# Patient Record
Sex: Female | Born: 1975 | Race: White | Hispanic: No | Marital: Single | Smoking: Current every day smoker
Health system: Southern US, Community
[De-identification: ages and names within clinical notes are randomized; demographics above are authoritative.]

## PROBLEM LIST (undated history)

## (undated) DIAGNOSIS — Z789 Other specified health status: Secondary | ICD-10-CM

## (undated) HISTORY — PX: CHOLECYSTECTOMY: SHX55

---

## 1999-04-14 ENCOUNTER — Encounter: Payer: Self-pay | Admitting: Internal Medicine

## 1999-04-14 ENCOUNTER — Ambulatory Visit (HOSPITAL_COMMUNITY): Admission: RE | Admit: 1999-04-14 | Discharge: 1999-04-14 | Payer: Self-pay | Admitting: Internal Medicine

## 1999-05-01 ENCOUNTER — Encounter: Payer: Self-pay | Admitting: General Surgery

## 1999-05-02 ENCOUNTER — Encounter: Payer: Self-pay | Admitting: General Surgery

## 1999-05-02 ENCOUNTER — Ambulatory Visit (HOSPITAL_COMMUNITY): Admission: RE | Admit: 1999-05-02 | Discharge: 1999-05-03 | Payer: Self-pay | Admitting: General Surgery

## 2003-02-05 ENCOUNTER — Other Ambulatory Visit: Admission: RE | Admit: 2003-02-05 | Discharge: 2003-02-05 | Payer: Self-pay | Admitting: Obstetrics & Gynecology

## 2004-02-03 ENCOUNTER — Emergency Department (HOSPITAL_COMMUNITY): Admission: EM | Admit: 2004-02-03 | Discharge: 2004-02-03 | Payer: Self-pay | Admitting: Emergency Medicine

## 2004-02-15 ENCOUNTER — Other Ambulatory Visit: Admission: RE | Admit: 2004-02-15 | Discharge: 2004-02-15 | Payer: Self-pay | Admitting: Obstetrics and Gynecology

## 2004-06-28 ENCOUNTER — Ambulatory Visit (HOSPITAL_COMMUNITY): Admission: RE | Admit: 2004-06-28 | Discharge: 2004-06-28 | Payer: Self-pay | Admitting: Obstetrics & Gynecology

## 2006-07-06 ENCOUNTER — Emergency Department (HOSPITAL_COMMUNITY): Admission: EM | Admit: 2006-07-06 | Discharge: 2006-07-06 | Payer: Self-pay | Admitting: Emergency Medicine

## 2006-07-15 ENCOUNTER — Emergency Department (HOSPITAL_COMMUNITY): Admission: EM | Admit: 2006-07-15 | Discharge: 2006-07-15 | Payer: Self-pay | Admitting: Emergency Medicine

## 2006-07-17 ENCOUNTER — Ambulatory Visit: Payer: Self-pay | Admitting: Orthopedic Surgery

## 2006-08-29 ENCOUNTER — Ambulatory Visit: Payer: Self-pay | Admitting: Orthopedic Surgery

## 2006-10-10 ENCOUNTER — Ambulatory Visit: Payer: Self-pay | Admitting: Orthopedic Surgery

## 2006-10-14 ENCOUNTER — Ambulatory Visit (HOSPITAL_COMMUNITY): Admission: RE | Admit: 2006-10-14 | Discharge: 2006-10-14 | Payer: Self-pay | Admitting: Orthopedic Surgery

## 2006-10-17 ENCOUNTER — Ambulatory Visit: Payer: Self-pay | Admitting: Orthopedic Surgery

## 2006-11-25 ENCOUNTER — Ambulatory Visit: Payer: Self-pay | Admitting: Orthopedic Surgery

## 2006-12-25 ENCOUNTER — Ambulatory Visit: Payer: Self-pay | Admitting: Orthopedic Surgery

## 2006-12-25 DIAGNOSIS — M79609 Pain in unspecified limb: Secondary | ICD-10-CM

## 2007-01-24 ENCOUNTER — Other Ambulatory Visit: Admission: RE | Admit: 2007-01-24 | Discharge: 2007-01-24 | Payer: Self-pay | Admitting: Obstetrics & Gynecology

## 2007-02-05 ENCOUNTER — Ambulatory Visit: Payer: Self-pay | Admitting: Orthopedic Surgery

## 2007-10-05 ENCOUNTER — Encounter: Payer: Self-pay | Admitting: Orthopedic Surgery

## 2007-10-05 ENCOUNTER — Emergency Department (HOSPITAL_COMMUNITY): Admission: EM | Admit: 2007-10-05 | Discharge: 2007-10-05 | Payer: Self-pay | Admitting: Emergency Medicine

## 2007-10-15 ENCOUNTER — Ambulatory Visit: Payer: Self-pay | Admitting: Orthopedic Surgery

## 2007-10-15 DIAGNOSIS — M25539 Pain in unspecified wrist: Secondary | ICD-10-CM

## 2007-10-15 DIAGNOSIS — R51 Headache: Secondary | ICD-10-CM

## 2007-10-15 DIAGNOSIS — S4350XA Sprain of unspecified acromioclavicular joint, initial encounter: Secondary | ICD-10-CM | POA: Insufficient documentation

## 2007-10-15 DIAGNOSIS — R519 Headache, unspecified: Secondary | ICD-10-CM | POA: Insufficient documentation

## 2008-05-11 ENCOUNTER — Other Ambulatory Visit: Admission: RE | Admit: 2008-05-11 | Discharge: 2008-05-11 | Payer: Self-pay | Admitting: Obstetrics and Gynecology

## 2008-06-06 ENCOUNTER — Emergency Department (HOSPITAL_COMMUNITY): Admission: EM | Admit: 2008-06-06 | Discharge: 2008-06-06 | Payer: Self-pay | Admitting: Emergency Medicine

## 2008-06-13 ENCOUNTER — Emergency Department (HOSPITAL_COMMUNITY): Admission: EM | Admit: 2008-06-13 | Discharge: 2008-06-13 | Payer: Self-pay | Admitting: Emergency Medicine

## 2009-04-20 ENCOUNTER — Other Ambulatory Visit: Admission: RE | Admit: 2009-04-20 | Discharge: 2009-04-20 | Payer: Self-pay | Admitting: Obstetrics & Gynecology

## 2010-08-04 NOTE — Op Note (Signed)
[place]  Patient:    Ana Jackson, Ana Jackson                   MRN: [id number] Proc. Date: 05/02/99 Adm. Date:  [id number] Attending:  Glenna Fellows Tappan                           Operative Report  PREOPERATIVE DIAGNOSIS:  Cholelithiasis and cholecystitis.  POSTOPERATIVE DIAGNOSIS:  Cholelithiasis and cholecystitis.  OPERATION PERFORMED:  Laparoscopic cholecystectomy with intraoperative cholangiogram.  SURGEON:  Sharlet Salina T. Hoxworth, M.D.  ASSISTANT:  Rose Phi. Maple Hudson, M.D.  ANESTHESIA:  General.  INDICATIONS FOR PROCEDURE:  Ana Jackson is a 35 year old white female with a two-year history of gradually worsening episodes of upper abdominal pain radiating to her back associated with nausea and vomiting.  Ultrasound has shown multiple  gallstones and a thickened gallbladder wall.  Laparoscopic cholecystectomy with  cholangiogram has been recommended and accepted.  The nature of the procedure, ts indications and risks of bleeding, infection, bile leak and bile duct injury were discussed and understood preoperatively.  She is now brought to the operating room for this procedure.  DESCRIPTION OF PROCEDURE:  The patient was brought to the operating room and placed in supine position on the operating table and general endotracheal anesthesia was induced.  PAS were in place.  She received Ancef preoperatively.  The abdomen was sterilely prepped and draped.  Local anesthesia was used to infiltrate the trocar sites. A 1 cm incision was made at the umbilicus and dissection carried down to  midline fascia.  This was sharply incised for 1 cm and the peritoneum entered under direct vision and then a Hasson trocar inserted through a mattress suture of 0 Vicryl.  Under direct vision, a 10 mm trocar was placed in the subxiphoid area nd two 5 mm trocars along the right subcostal margin.  The gallbladder was visualized. It was somewhat  chronically thickened.  The fundus was elevated up over the liver. Adhesions were taken down bluntly and with cautery and the infundibulum was exposed and retracted inferolaterally.  Fibrofatty tissue was stripped down to the neck of the gallbladder towards the porta hepatis.  The distal gallbladder was thoroughly dissected.  The cystic duct was identified and the cystic duct gallbladder junction dissected 360 degrees.  The cystic artery was dissected free and to be coursing up on the gallbladder wall.  At this point the cystic duct was clipped at its junction with the gallbladder and operative cholangiograms obtained through the cystic duct. This showed good filling of a normal common bile duct and common hepatic duct with free flow into the duodenum and no filling defects.  Following this the cholangiocath was removed and the cystic duct was doubly clipped proximally and  divided.  The cystic artery was doubly clipped proximally, clipped distally and  divided.  The gallbladder was then dissected free from its bed using hook and spatula cautery and removed intact from the umbilicus.  Several large stones had to be withdrawn.  The right upper quadrant was irrigated and thoroughly inspected or hemostasis which was complete.  Trocars were removed under direct vision.  All O2 evacuated from the peritoneal cavity.  The pursestring suture was secured at the umbilicus. Skin incisions were closed with interrupted subcuticular of 4-0 Monocryl and Steri-Strips.  Sponge, needle and instrument counts were correct.  A dry sterile dressing was applied and the patient taken to recovery  in good condition. DD:  05/02/99 TD:  05/02/99 Job: [id number]

## 2010-08-04 NOTE — Op Note (Signed)
NAMEJONIQUE, KULIG            ACCOUNT NO.:  [id number]   MEDICAL RECORD NO.:  [id number]          PATIENT TYPE:  AMB   LOCATION:  DAY                           FACILITY:  [facility]   PHYSICIAN:  Lazaro Arms, M.D.   DATE OF BIRTH:  [birth year]   DATE OF PROCEDURE:  06/28/2004  DATE OF DISCHARGE:                                 OPERATIVE REPORT   PREOPERATIVE DIAGNOSIS:  High grade cervical dysplasia.   POSTOPERATIVE DIAGNOSIS:  High grade cervical dysplasia.   OPERATION PERFORMED:  Laser ablation of the cervix.   SURGEON:  Lazaro Arms, M.D.   ANESTHESIA:  Laryngeal mask airway.   FINDINGS:  The patient had biopsy proven high grade dysplasia in the office.  She actually had moderate dysplasia about 18 months ago, delayed  intervention at that time and is here for laser ablation of the cervix.  She  has a very large lesion at time of colposcopy today extending high into the  anterior lip of the cervix.   DESCRIPTION OF PROCEDURE:  The patient was taken to the operating room and  placed in supine position where she underwent laryngeal mask airway.  She  was placed in dorsal lithotomy position.  A Graves speculum was placed.  A  paracervical block was placed.  Acetic acid was placed.  Holmium laser was  used.  Got a good distance around all the dysplastic areas.  Took it down to  5 mm depth ablation. There was no blood loss.  Monsels was placed.  The  patient tolerated the procedure well.  She was awakened from anesthesia,  taken to recovery room in good stable condition, all counts correct.      LHE/MEDQ  D:  06/28/2004  T:  06/28/2004  Job:  [id number]

## 2010-11-11 IMAGING — CR DG CHEST 2V
2 series · 2 of 2 positions shown · non-contrast
Comparison: None

CLINICAL DATA: Pain

CHEST - 2 VIEW

[view not recorded (1 of 2)]
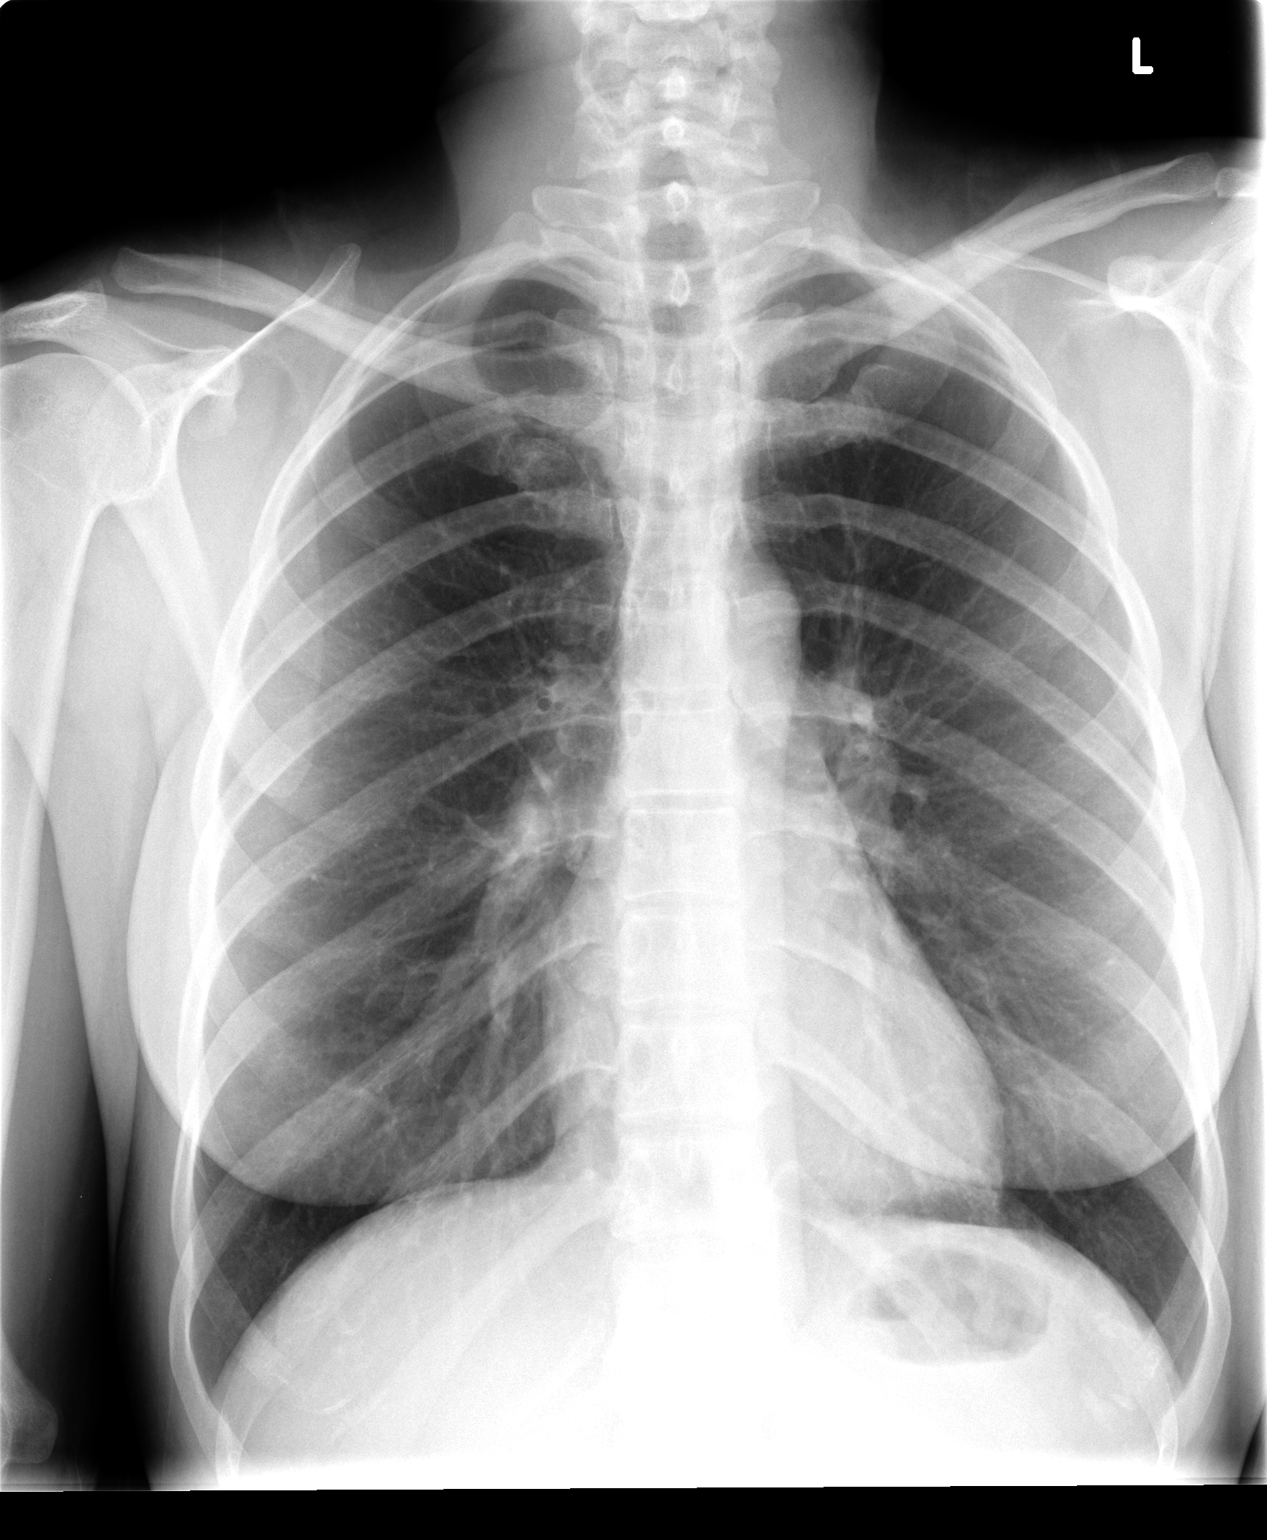

[view not recorded (2 of 2)]
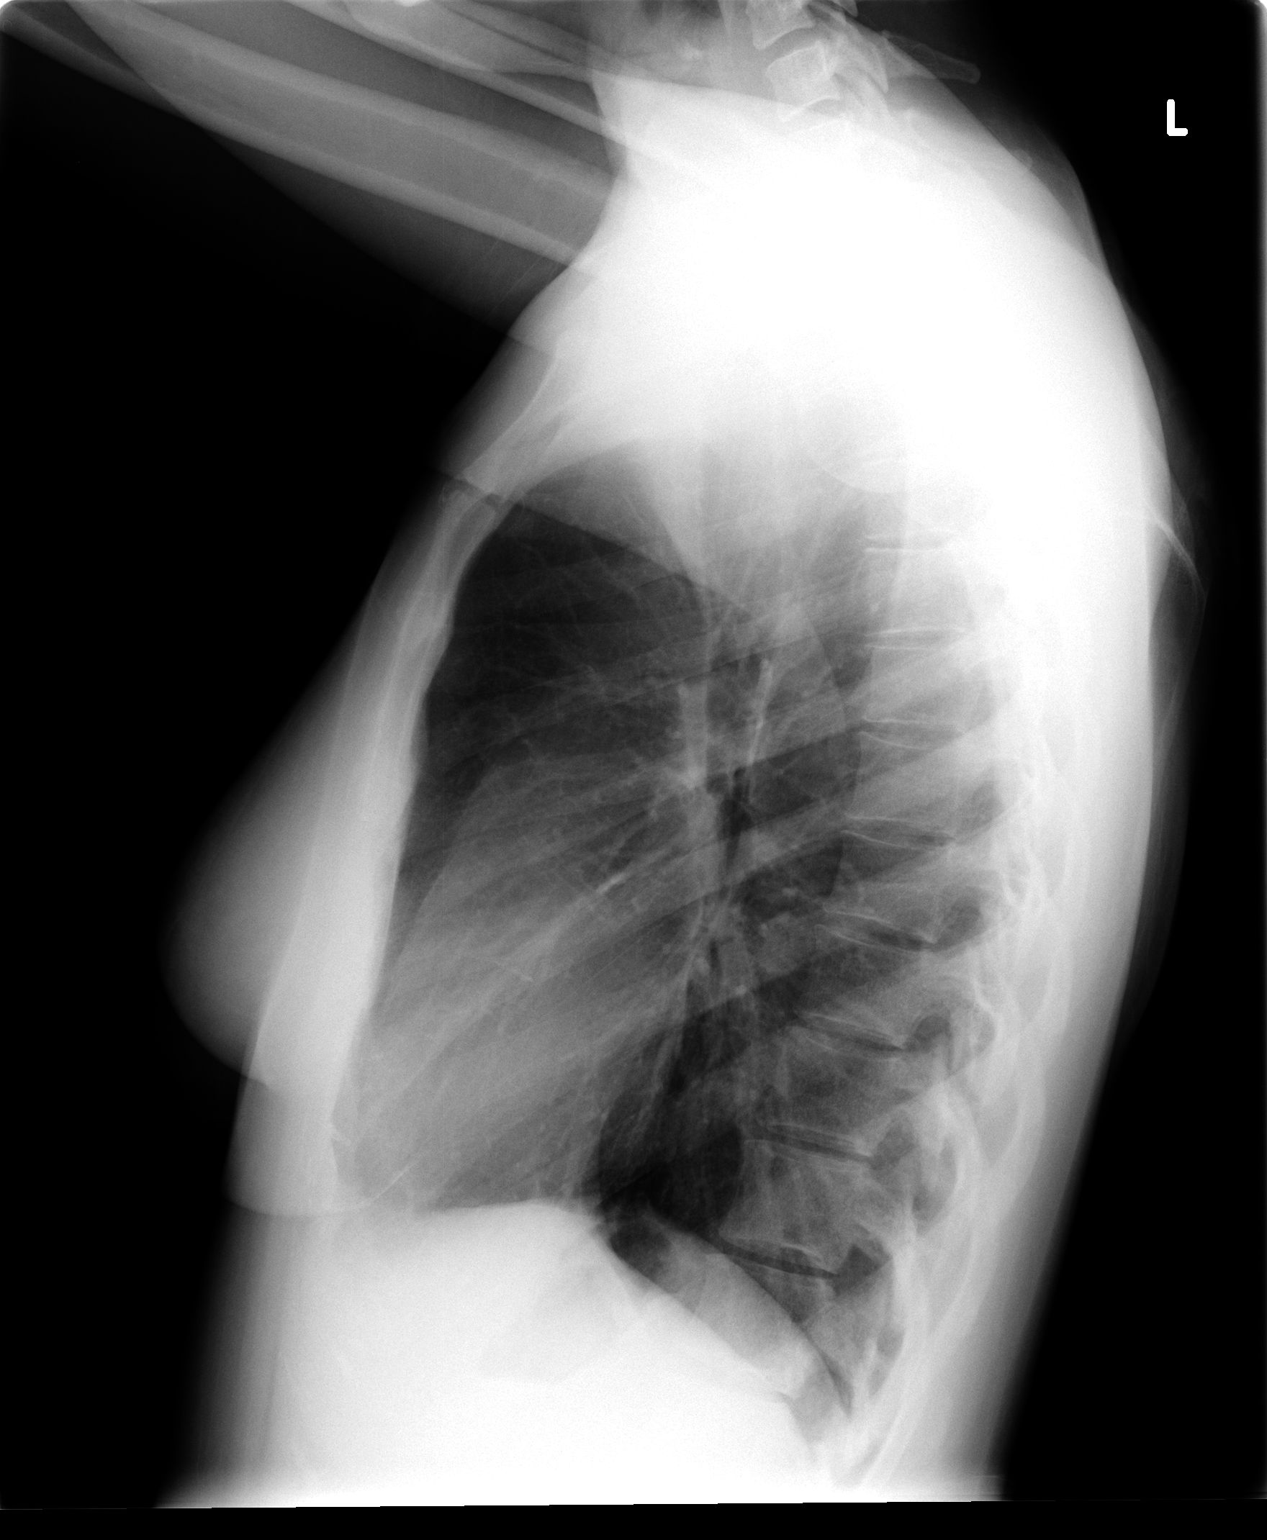

[2 of 2 positions shown; findings below may reference images not displayed]

FINDINGS: The heart size and mediastinal contours are within normal
limits.  Both lungs are clear.  The visualized skeletal structures
are unremarkable.
IMPRESSION: No acute findings.

## 2010-11-11 IMAGING — CR DG RIBS 2V*L*
3 series · 3 of 3 positions shown · non-contrast
Comparison: None

CLINICAL DATA: Pain.  Fall.

LEFT RIBS - 2 VIEW

[view not recorded (1 of 3)]
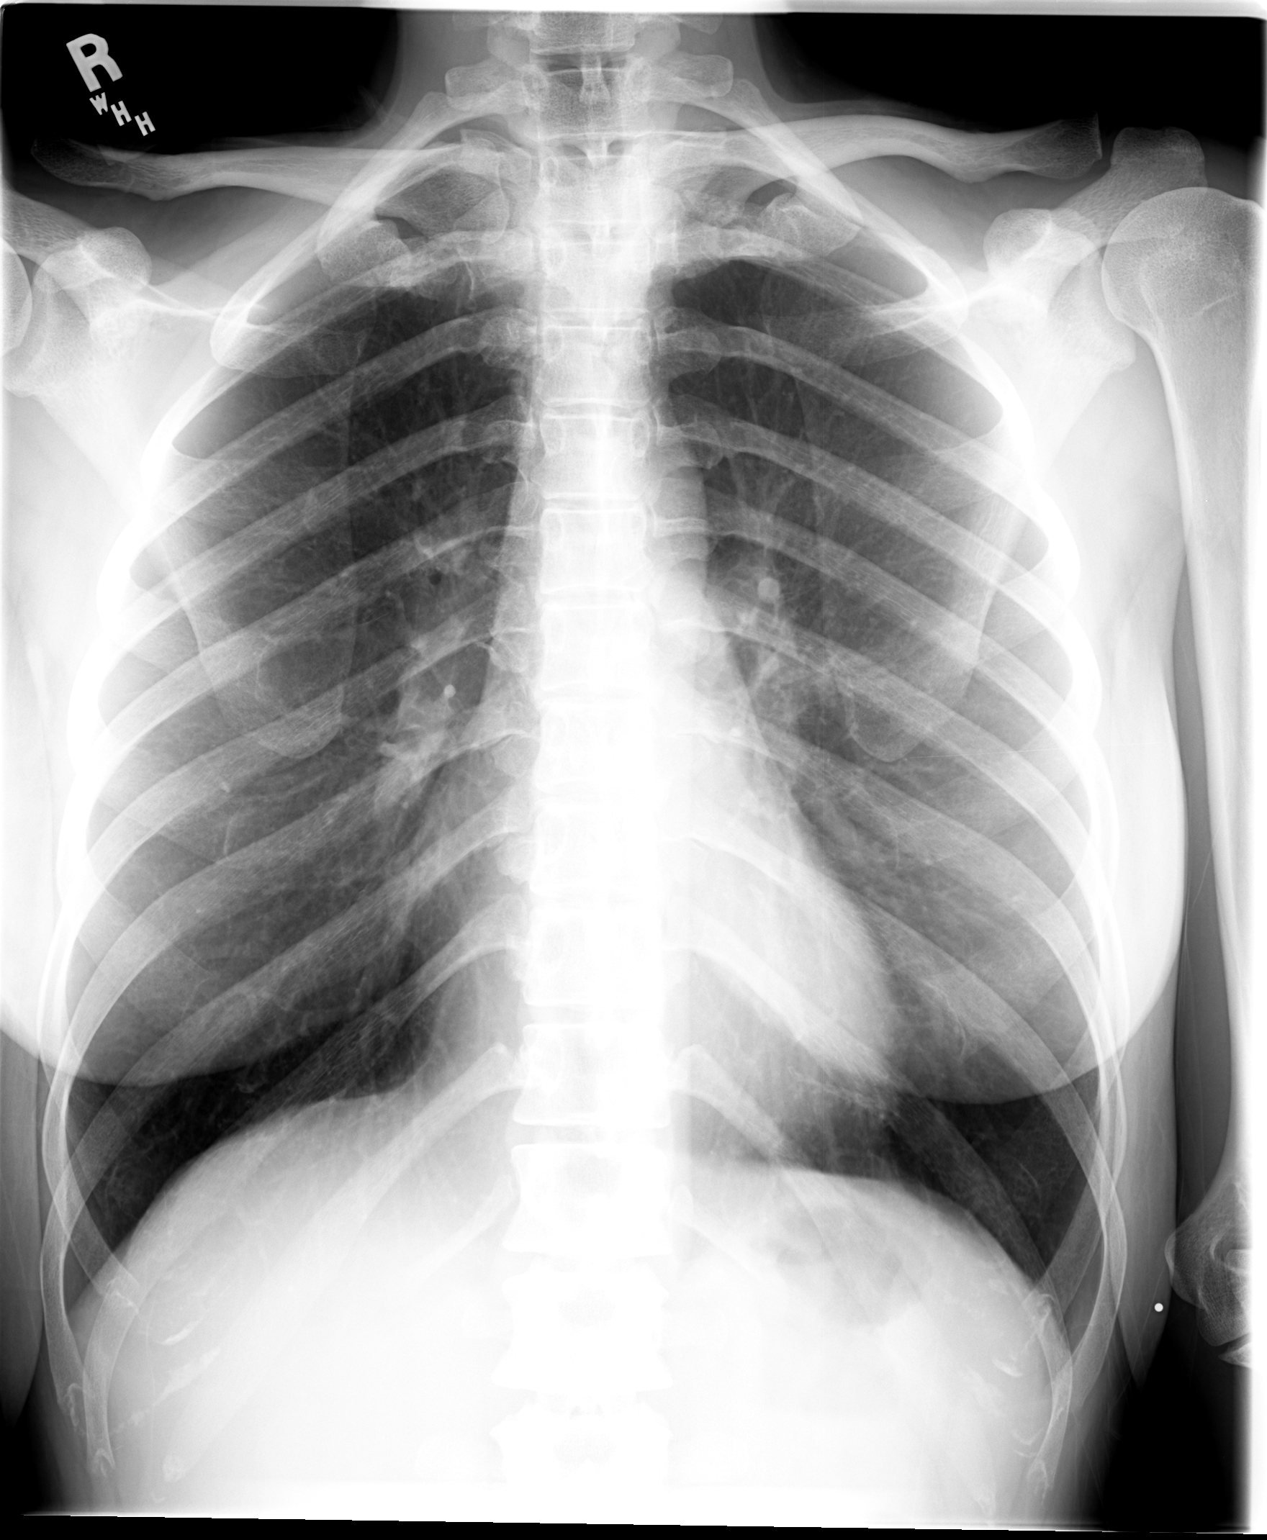

[view not recorded (2 of 3)]
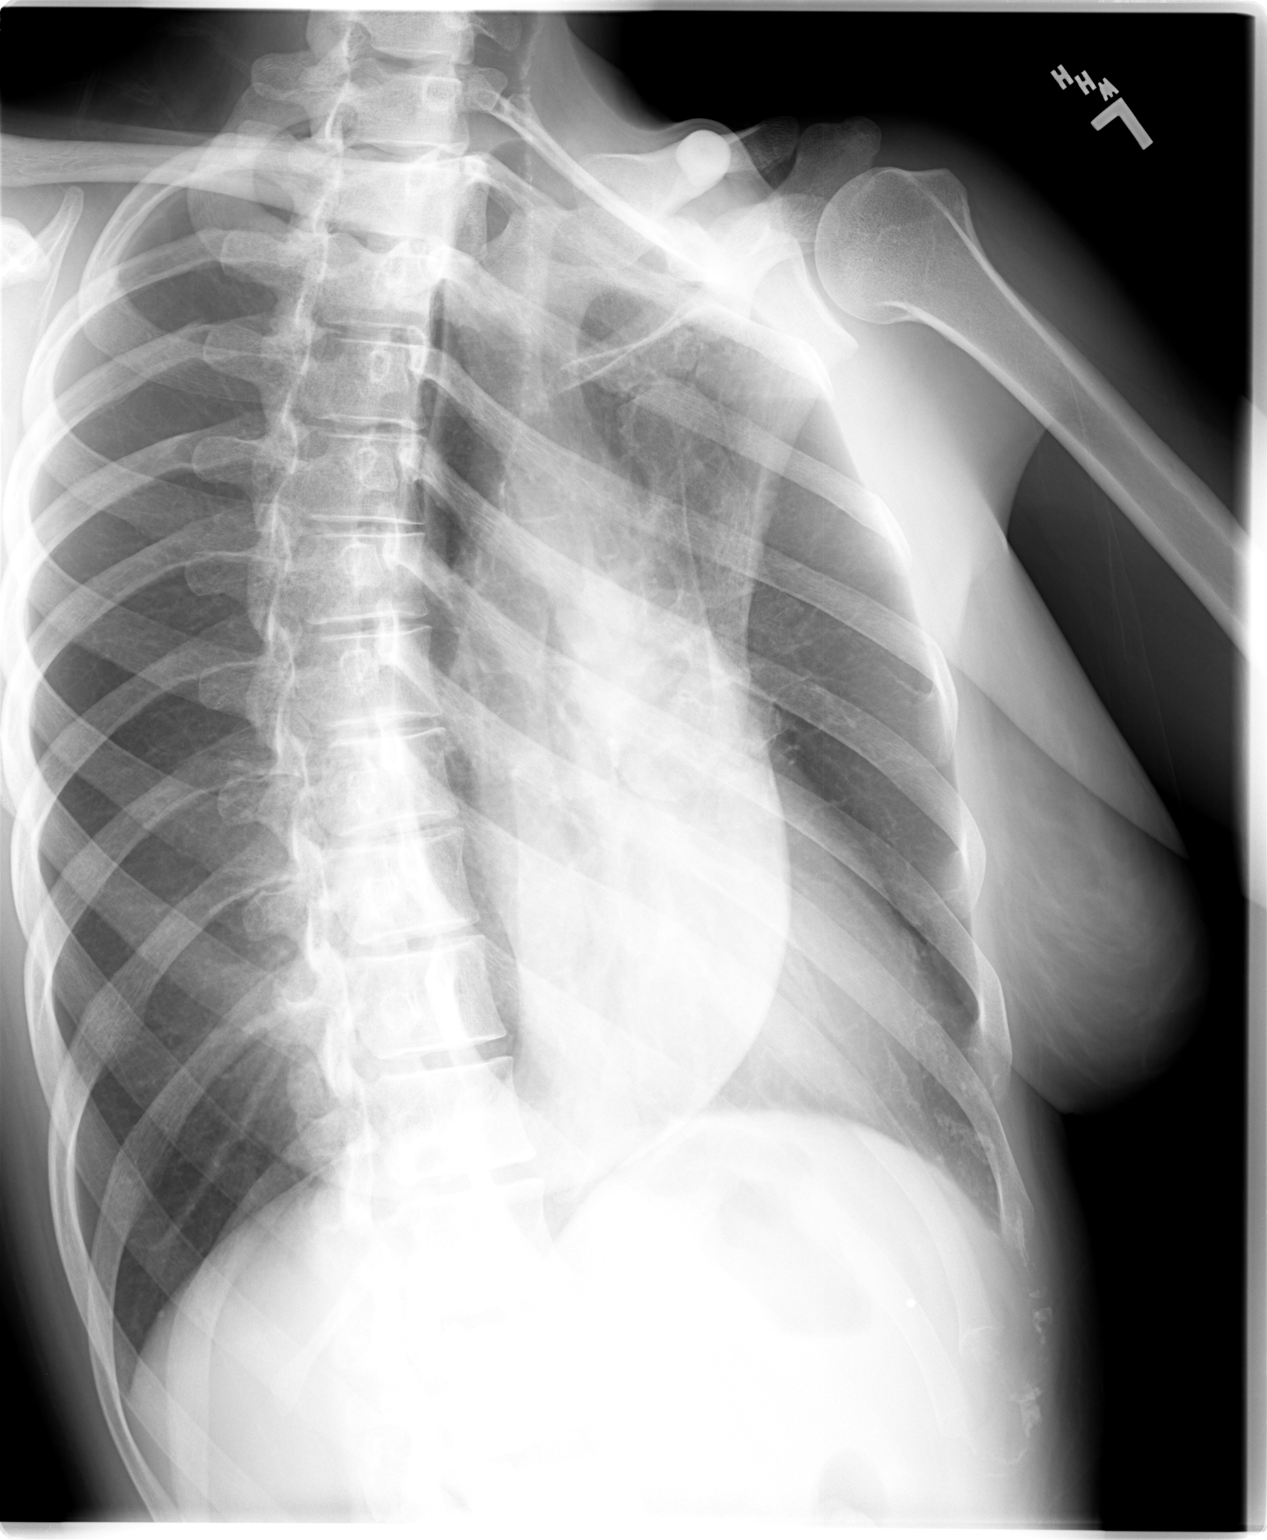

[view not recorded (3 of 3)]
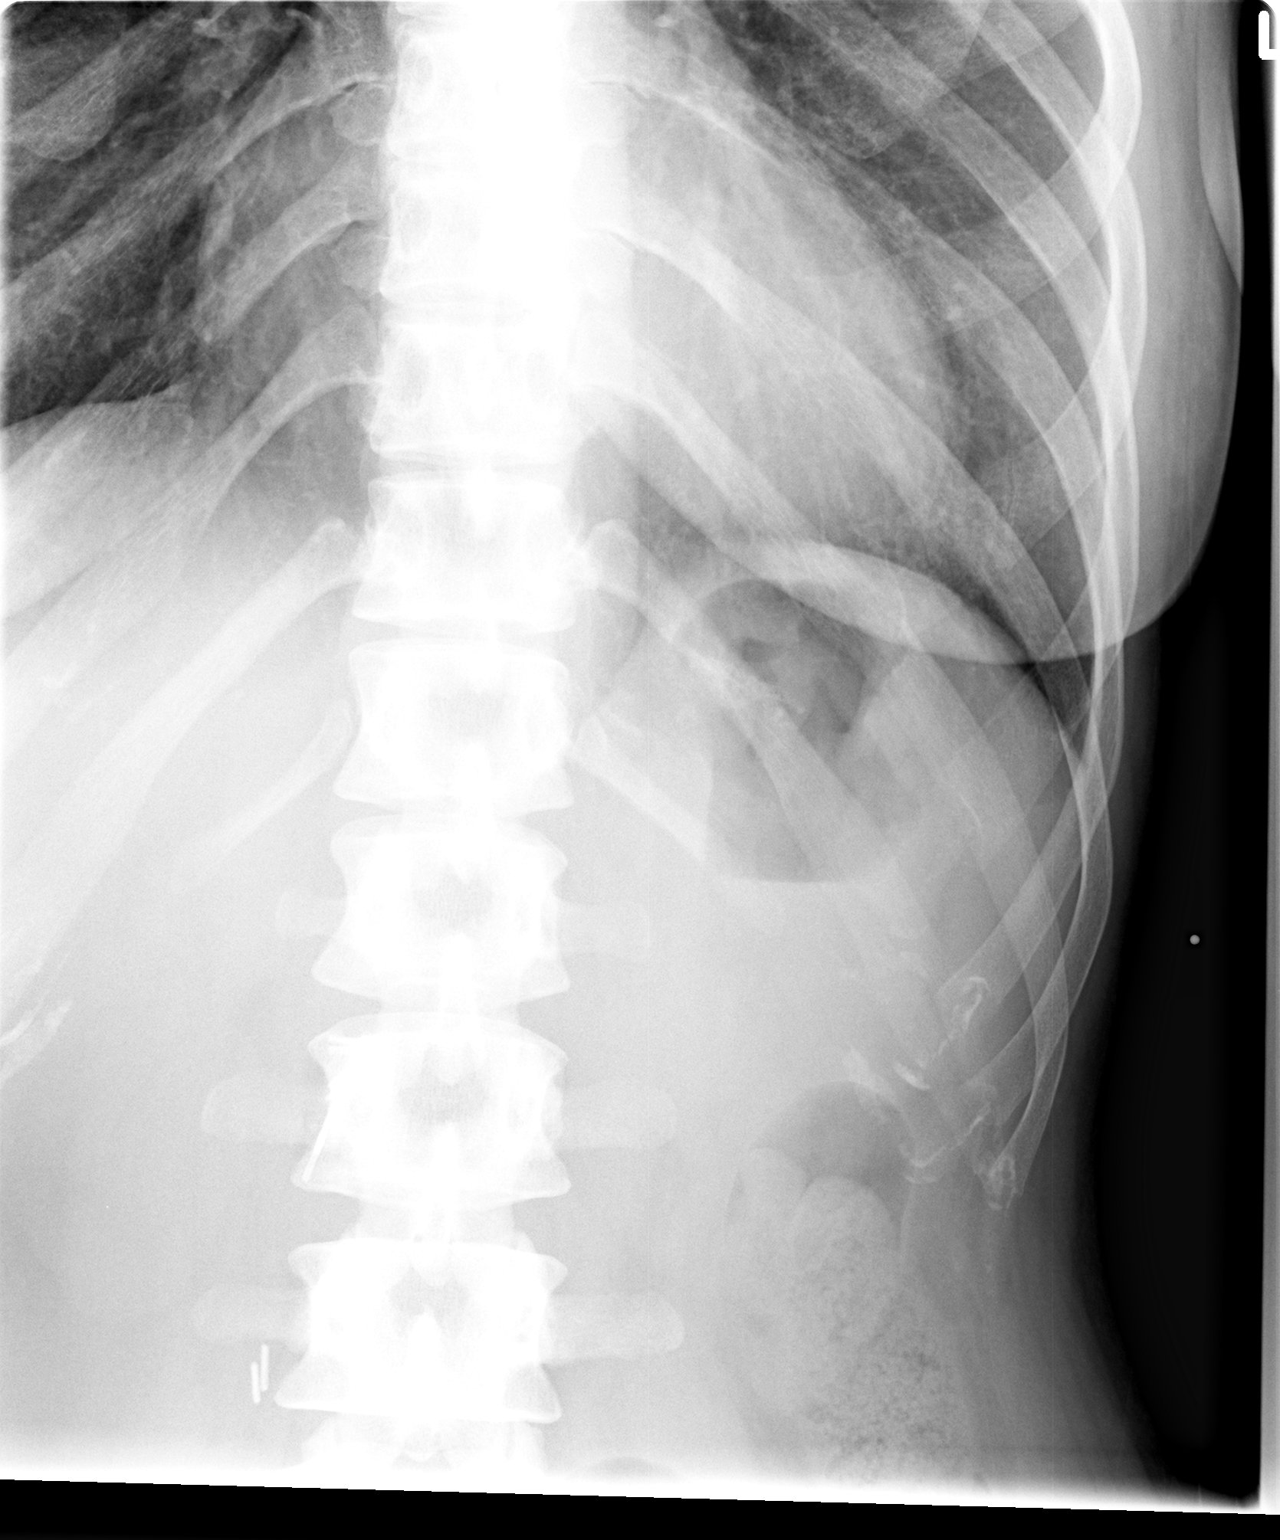

[3 of 3 positions shown; findings below may reference images not displayed]

FINDINGS: Coned-down views of the ribs show no acute findings.
IMPRESSION: No evidence for rib fracture.

## 2016-08-25 ENCOUNTER — Emergency Department (HOSPITAL_COMMUNITY): Payer: Self-pay

## 2016-08-25 ENCOUNTER — Emergency Department (HOSPITAL_COMMUNITY)
Admission: EM | Admit: 2016-08-25 | Discharge: 2016-08-25 | Disposition: A | Discharge status: Home / Self Care | Payer: Self-pay | Attending: Emergency Medicine | Admitting: Emergency Medicine

## 2016-08-25 ENCOUNTER — Encounter (HOSPITAL_COMMUNITY): Payer: Self-pay

## 2016-08-25 DIAGNOSIS — S0003XA Contusion of scalp, initial encounter: Secondary | ICD-10-CM

## 2016-08-25 DIAGNOSIS — Y939 Activity, unspecified: Secondary | ICD-10-CM | POA: Insufficient documentation

## 2016-08-25 DIAGNOSIS — S0101XA Laceration without foreign body of scalp, initial encounter: Secondary | ICD-10-CM

## 2016-08-25 DIAGNOSIS — R55 Syncope and collapse: Secondary | ICD-10-CM

## 2016-08-25 DIAGNOSIS — Y92480 Sidewalk as the place of occurrence of the external cause: Secondary | ICD-10-CM | POA: Insufficient documentation

## 2016-08-25 DIAGNOSIS — W1839XA Other fall on same level, initial encounter: Secondary | ICD-10-CM | POA: Insufficient documentation

## 2016-08-25 DIAGNOSIS — F17210 Nicotine dependence, cigarettes, uncomplicated: Secondary | ICD-10-CM | POA: Insufficient documentation

## 2016-08-25 DIAGNOSIS — R52 Pain, unspecified: Secondary | ICD-10-CM

## 2016-08-25 DIAGNOSIS — Y999 Unspecified external cause status: Secondary | ICD-10-CM | POA: Insufficient documentation

## 2016-08-25 LAB — COMPREHENSIVE METABOLIC PANEL
ALK PHOS: 54 U/L (ref 38–126)
ALT: 18 U/L (ref 14–54)
ANION GAP: 10 (ref 5–15)
AST: 18 U/L (ref 15–41)
Albumin: 4.2 g/dL (ref 3.5–5.0)
BILIRUBIN TOTAL: 0.8 mg/dL (ref 0.3–1.2)
BUN: 19 mg/dL (ref 6–20)
CALCIUM: 8.7 mg/dL — AB (ref 8.9–10.3)
CO2: 25 mmol/L (ref 22–32)
Chloride: 101 mmol/L (ref 101–111)
Creatinine, Ser: 0.77 mg/dL (ref 0.44–1.00)
GLUCOSE: 119 mg/dL — AB (ref 65–99)
Potassium: 3.7 mmol/L (ref 3.5–5.1)
Sodium: 136 mmol/L (ref 135–145)
TOTAL PROTEIN: 7.1 g/dL (ref 6.5–8.1)

## 2016-08-25 LAB — CBC WITH DIFFERENTIAL/PLATELET
BASOS ABS: 0.1 10*3/uL (ref 0.0–0.1)
BASOS PCT: 1 %
EOS ABS: 0.1 10*3/uL (ref 0.0–0.7)
Eosinophils Relative: 1 %
HCT: 44.6 % (ref 36.0–46.0)
HEMOGLOBIN: 15.1 g/dL — AB (ref 12.0–15.0)
Lymphocytes Relative: 25 %
Lymphs Abs: 2.7 10*3/uL (ref 0.7–4.0)
MCH: 31.5 pg (ref 26.0–34.0)
MCHC: 33.9 g/dL (ref 30.0–36.0)
MCV: 92.9 fL (ref 78.0–100.0)
MONOS PCT: 6 %
Monocytes Absolute: 0.7 10*3/uL (ref 0.1–1.0)
NEUTROS PCT: 67 %
Neutro Abs: 7.1 10*3/uL (ref 1.7–7.7)
Platelets: 317 10*3/uL (ref 150–400)
RBC: 4.8 MIL/uL (ref 3.87–5.11)
RDW: 12.5 % (ref 11.5–15.5)
WBC: 10.6 10*3/uL — AB (ref 4.0–10.5)

## 2016-08-25 LAB — ETHANOL

## 2016-08-25 MED ORDER — LIDOCAINE-EPINEPHRINE (PF) 2 %-1:200000 IJ SOLN
10.0000 mL | Freq: Once | INTRAMUSCULAR | Status: AC
  Administered 2016-08-25: 10 mL
  Filled 2016-08-25: qty 20

## 2016-08-25 MED ORDER — SODIUM CHLORIDE 0.9 % IV BOLUS (SEPSIS)
1000.0000 mL | Freq: Once | INTRAVENOUS | Status: AC
  Administered 2016-08-25: 1000 mL via INTRAVENOUS

## 2016-08-25 NOTE — ED Provider Notes (Addendum)
[facility] Provider Note   CSN: [id number] Arrival date & time: 08/25/16  0141  Time seen 02:55 AM   History   Chief Complaint Chief Complaint  Patient presents with  . Head Laceration    HPI Ana Jackson is a 41 y.o. female.  HPI  patient reports about 8:30 PM she was with a friend and the tire on the car was loose. Her friend had put the car up on a jack and was trying to tighten the lug nuts. Patient states she had just gotten out of the car and was standing with her hands on the hood. She states she hadn't been out there long. She states all of a sudden she didn't feel good, she felt hot without nausea, she got tingly all over and then her vision got black. She states she had a syncopal episode and her friend said she was out about a minute. When she woke up she didn't recognize her friend. She complains of pain on the right side of her neck and in her right shoulder, and her head. She's been having blurred vision off and on. She complains of being off balance although she walked by herself from the parking lot into the emergency department. She denies any nausea or vomiting. She states her  tetanus is up-to-date.  [facility]   History reviewed. No pertinent past medical history.  Patient Active Problem List   Diagnosis Date Noted  . WRIST PAIN, RIGHT 10/15/2007  . HEADACHE 10/15/2007  . ACROMIOCLAVICULAR SPRAIN AND STRAIN 10/15/2007  . FOOT PAIN, RIGHT 12/25/2006    Past Surgical History:  Procedure Laterality Date  . CHOLECYSTECTOMY      OB History    No data available       Home Medications    BCP's  Prior to Admission medications   Not on File    Family History History reviewed. No pertinent family history.  Social History Social History  Substance Use Topics  . Smoking status: Current Every Day Smoker    Types: Cigarettes  . Smokeless tobacco: Never Used  . Alcohol use 1.8 oz/week    3 Cans of  beer per week  unemployed Smokes 1 ppd   Allergies   Patient has no allergy information on record.   Review of Systems Review of Systems  All other systems reviewed and are negative.    Physical Exam Updated Vital Signs BP 96/70 (BP Location: Left Arm)   Pulse 87   Temp 98.3 F (36.8 C) (Oral)   Resp 18   Wt 58.1 kg (128 lb)   LMP 08/08/2016 (Approximate)   SpO2 97%   Vital signs normal except mild hypotension   Physical Exam  Constitutional: She is oriented to person, place, and time. She appears well-developed and well-nourished.  Non-toxic appearance. She does not appear ill. No distress.  HENT:  Head: Normocephalic.    Right Ear: External ear normal.  Left Ear: External ear normal.  Nose: Nose normal. No mucosal edema or rhinorrhea.  Mouth/Throat: Oropharynx is clear and moist and mucous membranes are normal. No dental abscesses or uvula swelling.  Patient has a laceration on her posterior scalp just to the right of midline. It is 1  cm  Eyes: Conjunctivae and EOM are normal. Pupils are equal, round, and reactive to light.  Neck: Normal range of motion and full passive range of motion without pain. Neck supple.    Cardiovascular: Normal rate, regular rhythm  and normal heart sounds.  Exam reveals no gallop and no friction rub.   No murmur heard. Pulmonary/Chest: Effort normal and breath sounds normal. No respiratory distress. She has no wheezes. She has no rhonchi. She has no rales. She exhibits no tenderness and no crepitus.  Abdominal: Soft. Normal appearance and bowel sounds are normal. She exhibits no distension. There is no tenderness. There is no rebound and no guarding.  Musculoskeletal: She exhibits no edema.       Right shoulder: She exhibits decreased range of motion, tenderness and bony tenderness.       Arms: Moves all extremities well.   Neurological: She is alert and oriented to person, place, and time. She has normal strength. No cranial nerve  deficit.  Patient is noted to have some slurred speech, she seems to have difficulty sometimes relating what she wants to say.  Skin: Skin is warm, dry and intact. No rash noted. No erythema. No pallor.  Psychiatric: She has a normal mood and affect. Her speech is normal and behavior is normal. Her mood appears not anxious.  Nursing note and vitals reviewed.    ED Treatments / Results  Labs (all labs ordered are listed, but only abnormal results are displayed) Results for orders placed or performed during the hospital encounter of 08/25/16  Comprehensive metabolic panel  Result Value Ref Range   Sodium 136 135 - 145 mmol/L   Potassium 3.7 3.5 - 5.1 mmol/L   Chloride 101 101 - 111 mmol/L   CO2 25 22 - 32 mmol/L   Glucose, Bld 119 (H) 65 - 99 mg/dL   BUN 19 6 - 20 mg/dL   Creatinine, Ser 1.61 0.44 - 1.00 mg/dL   Calcium 8.7 (L) 8.9 - 10.3 mg/dL   Total Protein 7.1 6.5 - 8.1 g/dL   Albumin 4.2 3.5 - 5.0 g/dL   AST 18 15 - 41 U/L   ALT 18 14 - 54 U/L   Alkaline Phosphatase 54 38 - 126 U/L   Total Bilirubin 0.8 0.3 - 1.2 mg/dL   GFR calc non Af Amer >60 >60 mL/min   GFR calc Af Amer >60 >60 mL/min   Anion gap 10 5 - 15  Ethanol  Result Value Ref Range   Alcohol, Ethyl (B) <5 <5 mg/dL  CBC with Differential  Result Value Ref Range   WBC 10.6 (H) 4.0 - 10.5 K/uL   RBC 4.80 3.87 - 5.11 MIL/uL   Hemoglobin 15.1 (H) 12.0 - 15.0 g/dL   HCT 09.6 04.5 - 40.9 %   MCV 92.9 78.0 - 100.0 fL   MCH 31.5 26.0 - 34.0 pg   MCHC 33.9 30.0 - 36.0 g/dL   RDW 81.1 91.4 - 78.2 %   Platelets 317 150 - 400 K/uL   Neutrophils Relative % 67 %   Neutro Abs 7.1 1.7 - 7.7 K/uL   Lymphocytes Relative 25 %   Lymphs Abs 2.7 0.7 - 4.0 K/uL   Monocytes Relative 6 %   Monocytes Absolute 0.7 0.1 - 1.0 K/uL   Eosinophils Relative 1 %   Eosinophils Absolute 0.1 0.0 - 0.7 K/uL   Basophils Relative 1 %   Basophils Absolute 0.1 0.0 - 0.1 K/uL   Laboratory interpretation all normal except mild  leukocytosis    EKG  EKG Interpretation  Date/Time:  Saturday August 25 2016 03:14:17 EDT Ventricular Rate:  70 PR Interval:    QRS Duration: 90 QT Interval:  405 QTC Calculation: 437 R  Axis:   86 Text Interpretation:  Sinus rhythm Probable left atrial enlargement ST elev, probable normal early repol pattern Baseline wander No old tracing to compare Confirmed by Devoria AlbeKnapp, Gigi Onstad ([id number]) on 08/25/2016 3:28:29 AM       EKG Interpretation  Date/Time:  Saturday August 25 2016 03:16:24 EDT Ventricular Rate:  85 PR Interval:    QRS Duration: 90 QT Interval:  392 QTC Calculation: 467 R Axis:   88 Text Interpretation:  Sinus rhythm Probable left atrial enlargement ST elev, probable normal early repol pattern No old tracing to compare Confirmed by Devoria AlbeKnapp, Akshara Blumenthal ([id number]) on 08/25/2016 3:29:05 AM       Radiology Dg Shoulder Right  Result Date: 08/25/2016 CLINICAL DATA:  Syncope tonight with right shoulder pain. EXAM: RIGHT SHOULDER - 2+ VIEW COMPARISON:  Radiographs 10/05/2007 FINDINGS: There is no evidence of fracture or dislocation. Mild widening of the acromioclavicular joint with small corticated densities in the joint space. Soft tissues are unremarkable. IMPRESSION: No acute fracture or subluxation of the right shoulder. Sequela of remote acromioclavicular joint injury. Electronically Signed   By: Rubye OaksMelanie  Ehinger M.D.   On: 08/25/2016 04:02   Ct Head Wo Contrast  Ct Cervical Spine Wo Contrast  Result Date: 08/25/2016 CLINICAL DATA:  Syncope tonight resulting in fall striking back of head on pavement. Laceration. EXAM: CT HEAD WITHOUT CONTRAST CT CERVICAL SPINE WITHOUT CONTRAST TECHNIQUE: Multidetector CT imaging of the head and cervical spine was performed following the standard protocol without intravenous contrast. Multiplanar CT image reconstructions of the cervical spine were also generated. COMPARISON:  Remote head CT 02/03/2004 FINDINGS: CT HEAD FINDINGS Brain: No evidence of acute infarction,  hemorrhage, hydrocephalus, extra-axial collection or mass lesion/mass effect. Vascular: No hyperdense vessel or unexpected calcification. Skull: Normal. Negative for fracture or focal lesion. Sinuses/Orbits: Mucosal thickening of left side of sphenoid sinus and both maxillary sinuses. Mastoid air cells are well-aerated. Visualized orbits are unremarkable. Other: Minimal occipital scalp edema. CT CERVICAL SPINE FINDINGS Alignment: Straightening of normal lordosis. No listhesis, jumped or perched facets. Lateral masses of C1 are well aligned on C2. Skull base and vertebrae: No acute fracture. Dens and skullbase are intact. Vertebral body heights are normal. Soft tissues and spinal canal: No prevertebral fluid or swelling. No visible canal hematoma. Disc levels: Minimal disc space narrowing and endplate spurring at C4-C5. Mild endplate spurring at C5-C6 with preservation of disc space. Upper chest: Negative. Other: None. IMPRESSION: 1.  No acute intracranial abnormality.  No skull fracture 2. No fracture or subluxation of cervical spine. Straightening of normal lordosis may be positioning or muscle spasm. 3. Mild sphenoid and maxillary sinus mucosal thickening. Electronically Signed   By: Rubye OaksMelanie  Ehinger M.D.   On: 08/25/2016 04:07    Procedures .Marland Kitchen.Laceration Repair Date/Time: 08/25/2016 4:37 AM Performed by: Lynelle DoctorKNAPP, Kamauri Denardo Authorized by: Devoria AlbeKNAPP, Ephriam Turman   Consent:    Consent obtained:  Verbal   Consent given by:  Patient   Risks discussed:  Pain and infection   Alternatives discussed:  No treatment Anesthesia (see MAR for exact dosages):    Anesthesia method:  Local infiltration   Local anesthetic:  Lidocaine 1% WITH epi Laceration details:    Location:  Scalp   Scalp location:  Crown   Length (cm):  1   Laceration depth: subcutaneous. Repair type:    Repair type:  Simple Exploration:    Wound extent: no foreign bodies/material noted, no underlying fracture noted and no vascular damage noted      Contaminated: no  Treatment:    Area cleansed with:  Betadine   Amount of cleaning:  Standard Skin repair:    Repair method:  Staples   Number of staples:  2 Approximation:    Approximation:  Loose   Vermilion border: well-aligned   Post-procedure details:    Dressing:  Antibiotic ointment   Patient tolerance of procedure:  Tolerated well, no immediate complications   (including critical care time)  Medications Ordered in ED Medications  lidocaine-EPINEPHrine (XYLOCAINE W/EPI) 2 %-1:200000 (PF) injection 10 mL (10 mLs Infiltration Given 08/25/16 0358)  sodium chloride 0.9 % bolus 1,000 mL (1,000 mLs Intravenous New Bag/Given 08/25/16 0525)  sodium chloride 0.9 % bolus 1,000 mL (0 mLs Intravenous Stopped 08/25/16 0610)     Initial Impression / Assessment and Plan / ED Course  I have reviewed the triage vital signs and the nursing notes.  Pertinent labs & imaging results that were available during my care of the patient were reviewed by me and considered in my medical decision making (see chart for details).  Orthostatic VS for the past 24 hrs:  BP- Lying Pulse- Lying BP- Sitting Pulse- Sitting BP- Standing at 0 minutes Pulse- Standing at 0 minutes  08/25/16 0440 91/68 68 93/75 83 103/77 112    Patient was given IV fluids for her hypotension. Her laceration was stapled. Laboratory testing was added due to these abnormalities. Patient was discharged home with her daughter.  Recheck at 7:10 AM daughter was given her blood test results. Patient's tachycardia has resolved, she sleeping and her blood pressure is 96 systolic. She was prepared for discharge. Daughter was informed to monitor her for the precautions on the head injury sheet and to return for any of those problems.  Final Clinical Impressions(s) / ED Diagnoses   Final diagnoses:  Syncope  Pain  Laceration of occipital region of scalp, initial encounter  Contusion of scalp, initial encounter    New  Prescriptions Acetaminophen OTC pain  Plan discharge  Devoria Albe, MD, Concha Pyo, MD 08/25/16 1610    Devoria Albe, MD 08/25/16 [phone]

## 2016-08-25 NOTE — ED Notes (Signed)
ED Provider at bedside. 

## 2016-08-25 NOTE — ED Triage Notes (Addendum)
Pt stated that about 2000hrs she fail back  And hit her head while her friend was changing the tire on a car. Pt states that she passed out as well.

## 2016-08-25 NOTE — Discharge Instructions (Signed)
You can wash your hair when you get home to get the blood out of your hair. Otherwise try to keep the laceration clean and dry. Use triple antibioitc ointment on the wound. Use ice packs for comfort. You can take acetaminophen for pain as needed. Return to the ED in 1 week to get your staples removed or see your doctor. Return to the ED for any problems listed on the head injury sheet or if you get worse.

## 2016-08-25 NOTE — Progress Notes (Signed)
tayor x ray transporter had patient's family remove and keep her earrings, and belly ring.

## 2016-09-01 ENCOUNTER — Emergency Department (HOSPITAL_COMMUNITY)
Admission: EM | Admit: 2016-09-01 | Discharge: 2016-09-01 | Disposition: A | Discharge status: Home Health | Payer: Medicaid Other | Attending: Emergency Medicine | Admitting: Emergency Medicine

## 2016-09-01 ENCOUNTER — Encounter (HOSPITAL_COMMUNITY): Payer: Self-pay | Admitting: Adult Health

## 2016-09-01 DIAGNOSIS — Z4802 Encounter for removal of sutures: Secondary | ICD-10-CM

## 2016-09-01 DIAGNOSIS — F17210 Nicotine dependence, cigarettes, uncomplicated: Secondary | ICD-10-CM | POA: Insufficient documentation

## 2016-09-01 DIAGNOSIS — Z79899 Other long term (current) drug therapy: Secondary | ICD-10-CM | POA: Insufficient documentation

## 2016-09-01 NOTE — ED Triage Notes (Signed)
Had staples to the head here last Saturday  Here for removal  No PCP

## 2016-09-01 NOTE — ED Provider Notes (Signed)
[facility] Provider Note   CSN: [id number] Arrival date & time: 09/01/16  1534     History   Chief Complaint Chief Complaint  Patient presents with  . Suture / Staple Removal    HPI Ana Jackson is a 41 y.o. female presenting for staple removal. She was seen here 7 days ago and had # staples placed in her posterior scalp, an injury incurred during a syncopal event.  She denies any symptoms at this time including increased pain, swelling or drainage from the wound site.  The history is provided by the patient.    History reviewed. No pertinent past medical history.  Patient Active Problem List   Diagnosis Date Noted  . WRIST PAIN, RIGHT 10/15/2007  . HEADACHE 10/15/2007  . ACROMIOCLAVICULAR SPRAIN AND STRAIN 10/15/2007  . FOOT PAIN, RIGHT 12/25/2006    Past Surgical History:  Procedure Laterality Date  . CHOLECYSTECTOMY      OB History    No data available       Home Medications    Prior to Admission medications   Medication Sig Start Date End Date Taking? Authorizing Provider  acetaminophen (TYLENOL) 325 MG tablet Take 650 mg by mouth every 6 (six) hours as needed for mild pain or headache.   Yes [provider]  Norgestimate-Ethinyl Estradiol Triphasic (ORTHO TRI-CYCLEN, 28,) 0.18/0.215/0.25 MG-35 MCG tablet Take 1 tablet by mouth daily.   Yes [provider]    Family History History reviewed. No pertinent family history.  Social History Social History  Substance Use Topics  . Smoking status: Current Every Day Smoker    Types: Cigarettes  . Smokeless tobacco: Never Used  . Alcohol use 1.8 oz/week    3 Cans of beer per week     Allergies   Patient has no known allergies.   Review of Systems Review of Systems  Constitutional: Negative for chills and fever.  Skin: Positive for wound.  Neurological: Negative for weakness, numbness and headaches.     Physical Exam Updated Vital Signs BP 107/69   Pulse (!) 56    Temp 98.7 F (37.1 C) (Oral)   Resp 18   LMP 08/08/2016 (Approximate)   SpO2 100%   Physical Exam  Constitutional: She is oriented to person, place, and time. She appears well-developed and well-nourished.  HENT:  Head: Normocephalic.  Cardiovascular: Normal rate.   Pulmonary/Chest: Effort normal.  Neurological: She is alert and oriented to person, place, and time. No sensory deficit.  Skin: Laceration noted.  Well approximated and healing laceration posterior scalp.     ED Treatments / Results  Labs (all labs ordered are listed, but only abnormal results are displayed) Labs Reviewed - No data to display  EKG  EKG Interpretation None       Radiology No results found.  Procedures Procedures (including critical care time)  SUTURE REMOVAL Performed by: Burgess AmorIDOL, Georgian Mcclory  Consent: Verbal consent obtained. Consent given by: patient Required items: required blood products, implants, devices, and special equipment available Time out: Immediately prior to procedure a "time out" was called to verify the correct patient, procedure, equipment, support staff and site/side marked as required.  Location: scalp  Wound Appearance: clean  Sutures/Staples Removed: 2  Patient tolerance: Patient tolerated the procedure well with no immediate complications.     Medications Ordered in ED Medications - No data to display   Initial Impression / Assessment and Plan / ED Course  I have reviewed the triage vital signs and the  nursing notes.  Pertinent labs & imaging results that were available during my care of the patient were reviewed by me and considered in my medical decision making (see chart for details).     Wound care instructions given.  Prn f/u anticipated.  Final Clinical Impressions(s) / ED Diagnoses   Final diagnoses:  Encounter for staple removal    New Prescriptions Discharge Medication List as of 09/01/2016  4:08 PM       Burgess Amor, PA-C 09/01/16  1658    Raeford Razor, MD 09/06/16 1104

## 2016-09-01 NOTE — ED Triage Notes (Signed)
2 staples to right posterior side of head, here for removal.

## 2017-08-01 ENCOUNTER — Encounter: Payer: Self-pay | Admitting: Obstetrics and Gynecology

## 2017-08-20 ENCOUNTER — Encounter: Payer: Self-pay | Admitting: *Deleted

## 2017-09-05 ENCOUNTER — Ambulatory Visit (INDEPENDENT_AMBULATORY_CARE_PROVIDER_SITE_OTHER): Payer: Self-pay | Admitting: Obstetrics and Gynecology

## 2017-09-05 ENCOUNTER — Encounter: Payer: Self-pay | Admitting: Obstetrics and Gynecology

## 2017-09-05 VITALS — BP 115/70 | HR 59 | Ht 66.5 in | Wt 142.4 lb

## 2017-09-05 DIAGNOSIS — A630 Anogenital (venereal) warts: Secondary | ICD-10-CM

## 2017-09-05 MED ORDER — TRICHLOROACETIC ACID 80 % EX LIQD
1.0000 | Freq: Once | CUTANEOUS | 0 refills | Status: AC
Start: 2017-09-05 — End: 2017-09-05

## 2017-09-05 NOTE — Progress Notes (Signed)
Patient ID: Ana DueKimberly S Atkin, female   DOB: Apr 27, 1975, 42 y.o.   MRN: [id number]   [facility]  @DATE @            Patient name: Ana Jackson MRN [id number]  Date of birth: Apr 27, 1975  CC & HPI:  Ana Jackson is a 42 y.o. female presenting today for genital warts that was first noticed a few months ago. The warts are uncomfortable due to placement, but she reports they do not bleed. She reports having a new sexual partner within the last few years, but adds she is no longer with them. [facility]. She was seen in our clinic many years ago. Her last pap was normal. The patient denies fever, chills or any other symptoms or complaints at this time.   ROS:  ROS +genital warts -fever -chills All systems are negative except as noted in the HPI and PMH.   Pertinent History Reviewed:   Reviewed:  Medical        History reviewed. No pertinent past medical history.                            Surgical Hx:    Past Surgical History:  Procedure Laterality Date  . CHOLECYSTECTOMY     Medications: Reviewed & Updated - see associated section                       Current Outpatient Medications:  .  acetaminophen (TYLENOL) 325 MG tablet, Take 650 mg by mouth every 6 (six) hours as needed for mild pain or headache., Disp: , Rfl:  .  NORA-BE 0.35 MG tablet, Take 1 tablet by mouth daily. , Disp: , Rfl: 4   Social History: Reviewed -  reports that she has been smoking cigarettes.  She has smoked for the past 26.00 years. She has never used smokeless tobacco.  Objective Findings:  Vitals: Blood pressure 115/70, pulse (!) 59, height 5' 6.5" (1.689 m), weight 142 lb 6.4 oz (64.6 kg).  PHYSICAL EXAMINATION General appearance - alert, well appearing, and in no distress, oriented to person, place, and time and normal appearing weight Mental status - alert, oriented to person, place, and time, normal mood, behavior, speech, dress, motor  activity, and thought processes, affect appropriate to mood PELVIC  Extensive condyloma to the labia majora around the periclitoral and perianal area ranging from 2mm - 5mm   Assessment & Plan:   A:  1. Vulvar Condyloma  P:  1. Rx TCA 80 % x15 ml c dropper. 2. F/u 1 week for TCA treatment and the q 3-4 weeks  By signing my name below, I, Diona BrownerJennifer Gorman, attest that this documentation has been prepared under the direction and in the presence of Tilda BurrowFerguson, Asees Manfredi V, MD. Electronically Signed: Diona BrownerJennifer Gorman, Medical Scribe. 09/05/17. 11:12 AM.  I personally performed the services described in this documentation, which was SCRIBED in my presence. The recorded information has been reviewed and considered accurate. It has been edited as necessary during review. Tilda BurrowJohn V Shady Bradish, MD

## 2017-09-05 NOTE — Patient Instructions (Signed)
Genital Warts Genital warts are small growths in the genital area or anal area. They are caused by a type of germ (HPV virus). This germ is spread from person to person during sex. It can be spread through vaginal, anal, and oral sex. Genital warts can lead to other problems if they are not treated. Follow these instructions at home: Medicines  Apply over-the-counter and prescription medicines only as told by your doctor.  Do not use medicines that are meant for treating hand warts.  Talk with your doctor about using anti-itch creams. General instructions  Do not touch or scratch the warts.  Do not have sex until your treatment is done.  Tell your current and past sexual partners about your condition. They may need treatment.  Keep all follow-up visits as told by your doctor. This is important.  After treatment, use condoms during sex. Other Instructions for Women  Women who have genital warts may need to be checked more often for cervical cancer.  If you become pregnant, tell your doctor that you have had genital warts. The germ can be passed to the baby. Contact a doctor if:  You have redness, swelling, or pain in the area of the treated skin.  You have a fever.  You feel generally sick.  You feel lumps in and around your genital area or anal area.  You have bleeding in your genital area or anal area.  You have pain during sex. This information is not intended to replace advice given to you by your health care provider. Make sure you discuss any questions you have with your health care provider. Document Released: 05/30/2009 Document Revised: 08/11/2015 Document Reviewed: 05/31/2014 Elsevier Interactive Patient Education  2018 Elsevier Inc.  

## 2017-09-13 ENCOUNTER — Ambulatory Visit: Payer: Medicaid Other | Admitting: Obstetrics and Gynecology

## 2017-09-13 ENCOUNTER — Encounter: Payer: Self-pay | Admitting: Obstetrics & Gynecology

## 2018-09-03 DIAGNOSIS — S61216A Laceration without foreign body of right little finger without damage to nail, initial encounter: Secondary | ICD-10-CM | POA: Insufficient documentation

## 2018-09-03 DIAGNOSIS — Y929 Unspecified place or not applicable: Secondary | ICD-10-CM | POA: Insufficient documentation

## 2018-09-03 DIAGNOSIS — W260XXA Contact with knife, initial encounter: Secondary | ICD-10-CM | POA: Insufficient documentation

## 2018-09-03 DIAGNOSIS — Y93G1 Activity, food preparation and clean up: Secondary | ICD-10-CM | POA: Insufficient documentation

## 2018-09-03 DIAGNOSIS — Y998 Other external cause status: Secondary | ICD-10-CM | POA: Insufficient documentation

## 2018-09-03 DIAGNOSIS — F17210 Nicotine dependence, cigarettes, uncomplicated: Secondary | ICD-10-CM | POA: Insufficient documentation

## 2018-09-04 ENCOUNTER — Encounter (HOSPITAL_COMMUNITY): Payer: Self-pay

## 2018-09-04 ENCOUNTER — Emergency Department (HOSPITAL_COMMUNITY)
Admission: EM | Admit: 2018-09-04 | Discharge: 2018-09-04 | Disposition: A | Discharge status: Other Acute Inpatient Hospital | Payer: Medicaid Other | Attending: Emergency Medicine | Admitting: Emergency Medicine

## 2018-09-04 ENCOUNTER — Emergency Department (HOSPITAL_COMMUNITY)
Admission: EM | Admit: 2018-09-04 | Discharge: 2018-09-04 | Discharge status: Against Medical Advice | Payer: Self-pay | Attending: Emergency Medicine | Admitting: Emergency Medicine

## 2018-09-04 ENCOUNTER — Encounter (HOSPITAL_COMMUNITY): Payer: Self-pay | Admitting: Emergency Medicine

## 2018-09-04 ENCOUNTER — Inpatient Hospital Stay (HOSPITAL_COMMUNITY)
Admission: AD | Admit: 2018-09-04 | Discharge: 2018-09-07 | DRG: 885 | Disposition: A | Discharge status: Home / Self Care | Payer: Federal, State, Local not specified - Other | Attending: Psychiatry | Admitting: Psychiatry

## 2018-09-04 ENCOUNTER — Other Ambulatory Visit: Payer: Self-pay

## 2018-09-04 DIAGNOSIS — F17210 Nicotine dependence, cigarettes, uncomplicated: Secondary | ICD-10-CM | POA: Diagnosis present

## 2018-09-04 DIAGNOSIS — R45851 Suicidal ideations: Secondary | ICD-10-CM | POA: Diagnosis present

## 2018-09-04 DIAGNOSIS — Z8249 Family history of ischemic heart disease and other diseases of the circulatory system: Secondary | ICD-10-CM

## 2018-09-04 DIAGNOSIS — Z20828 Contact with and (suspected) exposure to other viral communicable diseases: Secondary | ICD-10-CM | POA: Insufficient documentation

## 2018-09-04 DIAGNOSIS — Z560 Unemployment, unspecified: Secondary | ICD-10-CM | POA: Diagnosis not present

## 2018-09-04 DIAGNOSIS — Y929 Unspecified place or not applicable: Secondary | ICD-10-CM | POA: Insufficient documentation

## 2018-09-04 DIAGNOSIS — Z716 Tobacco abuse counseling: Secondary | ICD-10-CM | POA: Diagnosis not present

## 2018-09-04 DIAGNOSIS — F1290 Cannabis use, unspecified, uncomplicated: Secondary | ICD-10-CM | POA: Diagnosis present

## 2018-09-04 DIAGNOSIS — Z818 Family history of other mental and behavioral disorders: Secondary | ICD-10-CM

## 2018-09-04 DIAGNOSIS — G4700 Insomnia, unspecified: Secondary | ICD-10-CM | POA: Diagnosis present

## 2018-09-04 DIAGNOSIS — Z046 Encounter for general psychiatric examination, requested by authority: Secondary | ICD-10-CM | POA: Insufficient documentation

## 2018-09-04 DIAGNOSIS — F322 Major depressive disorder, single episode, severe without psychotic features: Principal | ICD-10-CM | POA: Diagnosis present

## 2018-09-04 DIAGNOSIS — Z915 Personal history of self-harm: Secondary | ICD-10-CM | POA: Diagnosis not present

## 2018-09-04 DIAGNOSIS — F1020 Alcohol dependence, uncomplicated: Secondary | ICD-10-CM | POA: Diagnosis present

## 2018-09-04 DIAGNOSIS — S61216A Laceration without foreign body of right little finger without damage to nail, initial encounter: Secondary | ICD-10-CM | POA: Insufficient documentation

## 2018-09-04 DIAGNOSIS — F10920 Alcohol use, unspecified with intoxication, uncomplicated: Secondary | ICD-10-CM

## 2018-09-04 DIAGNOSIS — S61216D Laceration without foreign body of right little finger without damage to nail, subsequent encounter: Secondary | ICD-10-CM

## 2018-09-04 DIAGNOSIS — F419 Anxiety disorder, unspecified: Secondary | ICD-10-CM | POA: Diagnosis present

## 2018-09-04 DIAGNOSIS — F10229 Alcohol dependence with intoxication, unspecified: Secondary | ICD-10-CM | POA: Insufficient documentation

## 2018-09-04 DIAGNOSIS — X788XXA Intentional self-harm by other sharp object, initial encounter: Secondary | ICD-10-CM | POA: Insufficient documentation

## 2018-09-04 DIAGNOSIS — Z9049 Acquired absence of other specified parts of digestive tract: Secondary | ICD-10-CM

## 2018-09-04 DIAGNOSIS — Y939 Activity, unspecified: Secondary | ICD-10-CM | POA: Insufficient documentation

## 2018-09-04 DIAGNOSIS — Y999 Unspecified external cause status: Secondary | ICD-10-CM | POA: Insufficient documentation

## 2018-09-04 HISTORY — DX: Other specified health status: Z78.9

## 2018-09-04 LAB — CBC WITH DIFFERENTIAL/PLATELET
Abs Immature Granulocytes: 0.02 10*3/uL (ref 0.00–0.07)
Basophils Absolute: 0.1 10*3/uL (ref 0.0–0.1)
Basophils Relative: 1 %
Eosinophils Absolute: 0.2 10*3/uL (ref 0.0–0.5)
Eosinophils Relative: 2 %
HCT: 49.1 % — ABNORMAL HIGH (ref 36.0–46.0)
Hemoglobin: 16.5 g/dL — ABNORMAL HIGH (ref 12.0–15.0)
Immature Granulocytes: 0 %
Lymphocytes Relative: 44 %
Lymphs Abs: 3.7 10*3/uL (ref 0.7–4.0)
MCH: 35 pg — ABNORMAL HIGH (ref 26.0–34.0)
MCHC: 33.6 g/dL (ref 30.0–36.0)
MCV: 104 fL — ABNORMAL HIGH (ref 80.0–100.0)
Monocytes Absolute: 0.7 10*3/uL (ref 0.1–1.0)
Monocytes Relative: 9 %
Neutro Abs: 3.8 10*3/uL (ref 1.7–7.7)
Neutrophils Relative %: 44 %
Platelets: 355 10*3/uL (ref 150–400)
RBC: 4.72 MIL/uL (ref 3.87–5.11)
RDW: 12.8 % (ref 11.5–15.5)
WBC: 8.4 10*3/uL (ref 4.0–10.5)
nRBC: 0 % (ref 0.0–0.2)

## 2018-09-04 LAB — COMPREHENSIVE METABOLIC PANEL
ALT: 18 U/L (ref 0–44)
AST: 24 U/L (ref 15–41)
Albumin: 4.4 g/dL (ref 3.5–5.0)
Alkaline Phosphatase: 72 U/L (ref 38–126)
Anion gap: 11 (ref 5–15)
BUN: 5 mg/dL — ABNORMAL LOW (ref 6–20)
CO2: 28 mmol/L (ref 22–32)
Calcium: 8.8 mg/dL — ABNORMAL LOW (ref 8.9–10.3)
Chloride: 103 mmol/L (ref 98–111)
Creatinine, Ser: 0.71 mg/dL (ref 0.44–1.00)
GFR calc Af Amer: >60 mL/min (ref >60)
GFR calc non Af Amer: >60 mL/min (ref >60)
Glucose, Bld: 89 mg/dL (ref 70–99)
Potassium: 3.5 mmol/L (ref 3.5–5.1)
Sodium: 142 mmol/L (ref 135–145)
Total Bilirubin: 0.5 mg/dL (ref 0.3–1.2)
Total Protein: 7.4 g/dL (ref 6.5–8.1)

## 2018-09-04 LAB — URINALYSIS, ROUTINE W REFLEX MICROSCOPIC
Bacteria, UA: NONE SEEN
Bilirubin Urine: NEGATIVE
Glucose, UA: NEGATIVE mg/dL
Ketones, ur: NEGATIVE mg/dL
Nitrite: NEGATIVE
Protein, ur: NEGATIVE mg/dL
Specific Gravity, Urine: 1 — ABNORMAL LOW (ref 1.005–1.030)
pH: 6 (ref 5.0–8.0)

## 2018-09-04 LAB — RAPID URINE DRUG SCREEN, HOSP PERFORMED
Amphetamines: NOT DETECTED
Barbiturates: NOT DETECTED
Benzodiazepines: POSITIVE — AB
Cocaine: NOT DETECTED
Opiates: NOT DETECTED
Tetrahydrocannabinol: POSITIVE — AB

## 2018-09-04 LAB — ETHANOL: Alcohol, Ethyl (B): 199 mg/dL — ABNORMAL HIGH (ref <10)

## 2018-09-04 LAB — SARS CORONAVIRUS 2 BY RT PCR (HOSPITAL ORDER, PERFORMED IN ~~LOC~~ HOSPITAL LAB): SARS Coronavirus 2: NEGATIVE

## 2018-09-04 LAB — SALICYLATE LEVEL: Salicylate Lvl: <7 mg/dL (ref 2.8–30.0)

## 2018-09-04 LAB — PREGNANCY, URINE: Preg Test, Ur: NEGATIVE

## 2018-09-04 LAB — ACETAMINOPHEN LEVEL: Acetaminophen (Tylenol), Serum: <10 ug/mL — ABNORMAL LOW (ref 10–30)

## 2018-09-04 MED ORDER — VITAMIN B-1 100 MG PO TABS
100.0000 mg | ORAL_TABLET | Freq: Every day | ORAL | Status: DC
  Administered 2018-09-05 – 2018-09-07 (×3): 100 mg via ORAL
  Filled 2018-09-04 (×5): qty 1

## 2018-09-04 MED ORDER — LIDOCAINE HCL (PF) 1 % IJ SOLN
30.0000 mL | Freq: Once | INTRAMUSCULAR | Status: DC
Start: 2018-09-04 — End: 2018-09-04
  Filled 2018-09-04: qty 30

## 2018-09-04 MED ORDER — LORAZEPAM 1 MG PO TABS
0.0000 mg | ORAL_TABLET | Freq: Four times a day (QID) | ORAL | Status: DC
  Administered 2018-09-04: 2 mg via ORAL
  Filled 2018-09-04: qty 2

## 2018-09-04 MED ORDER — NICOTINE 21 MG/24HR TD PT24
21.0000 mg | MEDICATED_PATCH | Freq: Every day | TRANSDERMAL | Status: DC
  Administered 2018-09-05 – 2018-09-07 (×3): 21 mg via TRANSDERMAL
  Filled 2018-09-04 (×6): qty 1

## 2018-09-04 MED ORDER — HYDROXYZINE HCL 25 MG PO TABS
25.0000 mg | ORAL_TABLET | Freq: Three times a day (TID) | ORAL | Status: DC | PRN
  Administered 2018-09-05 – 2018-09-07 (×5): 25 mg via ORAL
  Filled 2018-09-04: qty 1

## 2018-09-04 MED ORDER — LORAZEPAM 2 MG/ML IJ SOLN: 0.0000 mg | Freq: Four times a day (QID) | INTRAMUSCULAR | Status: DC

## 2018-09-04 MED ORDER — CHLORDIAZEPOXIDE HCL 25 MG PO CAPS
25.0000 mg | ORAL_CAPSULE | Freq: Four times a day (QID) | ORAL | Status: DC | PRN
  Administered 2018-09-04: 25 mg via ORAL
  Filled 2018-09-04: qty 1

## 2018-09-04 MED ORDER — ACETAMINOPHEN 325 MG PO TABS
650.0000 mg | ORAL_TABLET | Freq: Four times a day (QID) | ORAL | Status: DC | PRN
  Administered 2018-09-04 – 2018-09-07 (×6): 650 mg via ORAL
  Filled 2018-09-04 (×5): qty 2

## 2018-09-04 MED ORDER — ONDANSETRON 4 MG PO TBDP: 4.0000 mg | ORAL_TABLET | Freq: Four times a day (QID) | ORAL | Status: DC | PRN

## 2018-09-04 MED ORDER — LORAZEPAM 1 MG PO TABS: 0.0000 mg | ORAL_TABLET | Freq: Two times a day (BID) | ORAL | Status: DC

## 2018-09-04 MED ORDER — MAGNESIUM HYDROXIDE 400 MG/5ML PO SUSP: 30.0000 mL | Freq: Every day | ORAL | Status: DC | PRN

## 2018-09-04 MED ORDER — VITAMIN B-1 100 MG PO TABS
100.0000 mg | ORAL_TABLET | Freq: Every day | ORAL | Status: DC
  Administered 2018-09-04: 100 mg via ORAL
  Filled 2018-09-04: qty 1

## 2018-09-04 MED ORDER — ALUM & MAG HYDROXIDE-SIMETH 200-200-20 MG/5ML PO SUSP: 30.0000 mL | ORAL | Status: DC | PRN

## 2018-09-04 MED ORDER — NICOTINE 21 MG/24HR TD PT24
21.0000 mg | MEDICATED_PATCH | Freq: Once | TRANSDERMAL | Status: DC
  Administered 2018-09-04: 21 mg via TRANSDERMAL
  Filled 2018-09-04: qty 1

## 2018-09-04 MED ORDER — LIDOCAINE HCL (PF) 1 % IJ SOLN
INTRAMUSCULAR | Status: AC
  Filled 2018-09-04: qty 2

## 2018-09-04 MED ORDER — ADULT MULTIVITAMIN W/MINERALS CH
1.0000 | ORAL_TABLET | Freq: Every day | ORAL | Status: DC
  Administered 2018-09-05 – 2018-09-07 (×3): 1 via ORAL
  Filled 2018-09-04 (×5): qty 1

## 2018-09-04 MED ORDER — THIAMINE HCL 100 MG/ML IJ SOLN: 100.0000 mg | Freq: Once | INTRAMUSCULAR | Status: DC

## 2018-09-04 MED ORDER — LORAZEPAM 2 MG/ML IJ SOLN: 0.0000 mg | Freq: Two times a day (BID) | INTRAMUSCULAR | Status: DC

## 2018-09-04 MED ORDER — THIAMINE HCL 100 MG/ML IJ SOLN: 100.0000 mg | Freq: Every day | INTRAMUSCULAR | Status: DC

## 2018-09-04 MED ORDER — CEPHALEXIN 500 MG PO CAPS
500.0000 mg | ORAL_CAPSULE | Freq: Three times a day (TID) | ORAL | Status: DC
  Administered 2018-09-04: 500 mg via ORAL
  Filled 2018-09-04: qty 1

## 2018-09-04 MED ORDER — LIDOCAINE HCL (PF) 2 % IJ SOLN
INTRAMUSCULAR | Status: AC
  Filled 2018-09-04: qty 20

## 2018-09-04 MED ORDER — LIDOCAINE HCL (PF) 2 % IJ SOLN
INTRAMUSCULAR | Status: AC
  Administered 2018-09-04: 10 mL
  Filled 2018-09-04: qty 20

## 2018-09-04 MED ORDER — POVIDONE-IODINE 10 % EX SOLN
CUTANEOUS | Status: AC
  Administered 2018-09-04: 04:00:00
  Filled 2018-09-04: qty 15

## 2018-09-04 MED ORDER — HYDROXYZINE HCL 25 MG PO TABS
25.0000 mg | ORAL_TABLET | Freq: Four times a day (QID) | ORAL | Status: DC | PRN
  Administered 2018-09-04: 25 mg via ORAL
  Filled 2018-09-04 (×5): qty 1

## 2018-09-04 MED ORDER — LOPERAMIDE HCL 2 MG PO CAPS: 2.0000 mg | ORAL_CAPSULE | ORAL | Status: DC | PRN

## 2018-09-04 MED ORDER — LIDOCAINE HCL (PF) 2 % IJ SOLN: 10.0000 mL | Freq: Once | INTRAMUSCULAR | Status: DC

## 2018-09-04 MED ORDER — TRAZODONE HCL 50 MG PO TABS
50.0000 mg | ORAL_TABLET | Freq: Every evening | ORAL | Status: DC | PRN
  Administered 2018-09-04 – 2018-09-05 (×2): 50 mg via ORAL
  Filled 2018-09-04 (×2): qty 1

## 2018-09-04 MED ORDER — TETANUS-DIPHTH-ACELL PERTUSSIS 5-2.5-18.5 LF-MCG/0.5 IM SUSP: 0.5000 mL | Freq: Once | INTRAMUSCULAR | Status: DC
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
## 2018-09-04 NOTE — Progress Notes (Signed)
Patient ID: Ana Jackson, female   DOB: 06-13-1975, 44 y.o.   MRN: [id number] Patient was noted to have injury to left hand and arm patient was also noted to have healed scars to her left arm.  Patient denies self injury and states, "I don't know why I am here."  Patient admitted to the unit without incident.

## 2018-09-04 NOTE — ED Notes (Signed)
Pt walked out of facility.

## 2018-09-04 NOTE — ED Notes (Signed)
Patient walked to restroom to obtain a urine specimen. Specimen collected and sent to lab.

## 2018-09-04 NOTE — ED Notes (Signed)
Patient attempting to call family members at this time. Family does not want to speak with her. Patient is having outburst of anger and then crying episodes.

## 2018-09-04 NOTE — ED Notes (Signed)
Lindon Romp, NP, will complete disposition after collateral contact is made, attempted no answer. TTS will continue to contact the family for collateral contact.

## 2018-09-04 NOTE — ED Notes (Signed)
ED Provider at bedside. 
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
## 2018-09-04 NOTE — ED Notes (Signed)
Pt being explained IVC process

## 2018-09-04 NOTE — Progress Notes (Addendum)
Pt accepted to  [facility], Bed 302-1 Ana Jackson is the accepting provider.  [name], is the attending provider.  Call report to [phone]  [name] notified.   Pt is IVC  Pt may be transported by [employer] Pt scheduled  to arrive at [facility] as soon as Pt COVID-19 testing is resulted and transport is arranged   [name], MSW, [place] Disposition Clinical Social Work [phone] (cell) [phone] (office)

## 2018-09-04 NOTE — ED Provider Notes (Signed)
[facility] EMERGENCY DEPARTMENT Provider Note   CSN: [id number] Arrival date & time: 09/03/18  2354     History   Chief Complaint Chief Complaint  Patient presents with  . Laceration    HPI Laqueta DueKimberly S Gayler is a 43 y.o. female.     The history is provided by the patient. No language interpreter was used.  Laceration Location:  Finger Finger laceration location:  R little finger Depth:  Through dermis Quality: avulsion   Laceration mechanism:  Knife Pain details:    Quality:  Aching   Severity:  No pain   Progression:  Worsening Foreign body present:  Metal Relieved by:  Nothing Ineffective treatments:  None tried Tetanus status:  Up to date Associated symptoms: no redness   Pt states she cut her finger cutting vegetables  History reviewed. No pertinent past medical history.  Patient Active Problem List   Diagnosis Date Noted  . 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 WRIST PAIN, RIGHT 10/15/2007  . HEADACHE 10/15/2007  . ACROMIOCLAVICULAR SPRAIN AND STRAIN 10/15/2007  . FOOT PAIN, RIGHT 12/25/2006    Past Surgical History:  Procedure Laterality Date  . CHOLECYSTECTOMY       OB History    Gravida  1   Para  1   Term  1   Preterm      AB      Living  1     SAB      TAB      Ectopic      Multiple      Live Births  1            Home Medications    Prior to Admission medications   Medication Sig Start Date End Date Taking? Authorizing Provider  acetaminophen (TYLENOL) 325 MG tablet Take 650 mg by mouth every 6 (six) hours as needed for mild pain or headache.    [provider]  NORA-BE 0.35 MG tablet Take 1 tablet by mouth daily.  09/03/17   [provider]    Family History Family History  Problem Relation Age of Onset  . Cancer Maternal Grandmother   . Suicidality Father   . Hypertension Brother     Social History Social History   Tobacco Use  . Smoking status: Current Every Day Smoker   Years: 26.00    Types: Cigarettes  . Smokeless tobacco: Never Used  . Tobacco comment: smokes 10 cig daily  Substance Use Topics  . Alcohol use: Yes    Alcohol/week: 3.0 standard drinks    Types: 3 Cans of beer per week    Comment: sometimes  . Drug use: Yes    Types: Marijuana    Comment: every once in a while     Allergies   Patient has no known allergies.   Review of Systems Review of Systems  All other systems reviewed and are negative.    Physical Exam Updated Vital Signs BP 129/89 (BP Location: Right Arm)   Pulse 90   Temp 98.1 F (36.7 C) (Oral)   Resp 18   Ht 5\' 6"  (1.676 m)   Wt 58.1 kg   SpO2 98%   BMI 20.66 kg/m   Physical Exam Vitals signs reviewed.  HENT:     Head: Normocephalic.  Musculoskeletal:        General: Tenderness present.     Comments: Laceration left 5th finger, gapping nv and ns intact  Skin:    General: Skin is  warm.  Neurological:     General: No focal deficit present.     Mental Status: She is alert.  Psychiatric:        Mood and Affect: Mood normal.      ED Treatments / Results  Labs (all labs ordered are listed, but only abnormal results are displayed) Labs Reviewed - No data to display  EKG    Radiology No results found.  Procedures Procedures (including critical care time)  Medications Ordered in ED Medications  lidocaine (XYLOCAINE) 2 % injection 10 mL (has no administration in time range)  lidocaine (XYLOCAINE) 2 % injection (has no administration in time range)     Initial Impression / Assessment and Plan / ED Course  I have reviewed the triage vital signs and the nursing notes.  Pertinent labs & imaging results that were available during my care of the patient were reviewed by me and considered in my medical decision making (see chart for details).        Pt advised she needs sutures. Pt reported to have left after I left her room   I caught pt at triage.  She refuses sutures.  Rn will clean  and bandage.    Final Clinical Impressions(s) / ED Diagnoses   Final diagnoses:  Laceration of right little finger without damage to nail, foreign body presence unspecified, initial encounter    ED Discharge Orders    None       Sidney Ace 09/04/18 0121    Ezequiel Essex, MD 09/04/18 [phone]

## 2018-09-04 NOTE — ED Notes (Signed)
New patient contact "Ana Jackson" [phone]

## 2018-09-04 NOTE — ED Notes (Signed)
Pt refusing Tdap at this time. MD Notified.
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
## 2018-09-04 NOTE — ED Triage Notes (Signed)
Pt recently left AMA, and returned under [facility] Custody. Pt daughter took out IVC paperwork on Pt due Pt threatening Suicide for several years. Pts daughter reports Pt has been cutting self for years. Pt presents under ETOH with several scars on arms, cut marks on neck and open laceration to proximal right little finger and left posterior forearm. Pt loud and disruptive during triage.

## 2018-09-04 NOTE — ED Notes (Signed)
[facility] arrived to transport pt to [facility]

## 2018-09-04 NOTE — Discharge Instructions (Signed)
Take the antibiotic as prescribed.  Follow-up for suture removal in 1 week.  Return to the ED develop new or worsening symptoms.

## 2018-09-04 NOTE — Progress Notes (Signed)
D: Pt was in her room upon initial approach.  Pt presents with anxious affect and mood.  She reports her day was "pretty good" and her goal is to "sleep good."  Pt denies SI/HI, denies hallucinations, reports R pinky finger pain of 4/10.  Pt has laceration to R pinky finger and a dry, intact bandage is in place.  Pt has been visible in milieu interacting with peers and staff appropriately.    A: Introduced self to pt.  Met with pt 1:1.  Actively listened to pt and offered support and encouragement.  Medications administered per order.  PRN medication administered for pain and sleep.  15 minute safety checks maintained.  R: Pt is safe on the unit.  Pt is compliant with medications.  Pt verbally contracts for safety.  Will continue to monitor and assess.
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
## 2018-09-05 DIAGNOSIS — Z915 Personal history of self-harm: Secondary | ICD-10-CM

## 2018-09-05 DIAGNOSIS — F322 Major depressive disorder, single episode, severe without psychotic features: Principal | ICD-10-CM

## 2018-09-05 MED ORDER — FOLIC ACID 1 MG PO TABS
1.0000 mg | ORAL_TABLET | Freq: Every day | ORAL | Status: DC
  Administered 2018-09-05 – 2018-09-07 (×3): 1 mg via ORAL
  Filled 2018-09-05 (×5): qty 1

## 2018-09-05 MED ORDER — BACITRACIN-NEOMYCIN-POLYMYXIN OINTMENT TUBE
TOPICAL_OINTMENT | Freq: Two times a day (BID) | CUTANEOUS | Status: DC
  Administered 2018-09-05 – 2018-09-07 (×5): via TOPICAL
  Filled 2018-09-05: qty 14.17
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
## 2018-09-05 NOTE — Progress Notes (Signed)
[place] NOVEL CORONAVIRUS (COVID-19) DAILY CHECK-OFF SYMPTOMS - answer yes or no to each - every day NO YES  Have you had a fever in the past 24 hours?  . Fever (Temp > 37.80C / 100F) X   Have you had any of these symptoms in the past 24 hours? . New Cough .  Sore Throat  .  Shortness of Breath .  Difficulty Breathing .  Unexplained Body Aches   X   Have you had any one of these symptoms in the past 24 hours not related to allergies?   . Runny Nose .  Nasal Congestion .  Sneezing   X   If you have had runny nose, nasal congestion, sneezing in the past 24 hours, has it worsened?  X   EXPOSURES - check yes or no X   Have you traveled outside the state in the past 14 days?  X   Have you been in contact with someone with a confirmed diagnosis of COVID-19 or PUI in the past 14 days without wearing appropriate PPE?  X   Have you been living in the same home as a person with confirmed diagnosis of COVID-19 or a PUI (household contact)?    X   Have you been diagnosed with COVID-19?    X              What to do next: Answered NO to all: Answered YES to anything:   Proceed with unit schedule Follow the [facility] Inpatient Flowsheet.   

## 2018-09-05 NOTE — Progress Notes (Signed)
D: Pt was in dayroom upon initial approach.  Pt presents with labile affect and mood.  Her speech is loud and she uses profanity often.  Requires redirection multiple times tonight.  She reports her day has "been good."  Pt reports she is hoping to discharge soon.  Her goal is to "sleep good."  Pt denies SI/HI, denies hallucinations, reports R pinky finger pain of 7/10.  Pt has been visible in milieu.  A: Introduced self to pt.  Met with pt 1:1.  Actively listened to pt and offered support and encouragement.  PRN medication administered for anxiety, pain, and sleep.  15 minute safety checks in place.  R: Pt is safe on the unit.  Pt is compliant with medications.  Pt verbally contracts for safety.  Will continue to monitor and assess.

## 2018-09-05 NOTE — Plan of Care (Signed)
  Problem: Education: Goal: Emotional status will improve Outcome: Progressing   

## 2018-09-05 NOTE — H&P (Signed)
Psychiatric Admission Assessment Adult  Patient Identification: Ana Jackson MRN:  [id number] Date of Evaluation:  09/05/2018 Chief Complaint:  Alcohol Dependence Principal Diagnosis: <principal problem not specified> Diagnosis:  Active Problems:   MDD (major depressive disorder), severe (HCC)  History of Present Illness: Patient is seen and examined.  Patient is a 43 year old female who was brought to the [facility] emergency department on 09/04/2018 under involuntary commitment.  The patient stated that there was a misunderstanding, and that she cut herself on her right hand while cooking, but her boyfriend brought her to the emergency department secondary to her self injury as well as excessive alcohol usage.  There was significant worry that this was a suicide attempt or at least self-harm.  The patient denies any suicidal ideation and stated that she just mainly has "anxiety".  Collateral information obtained from her daughter in the emergency department stated that the patient had been threatening suicide for several years and had been cutting herself.  She did sustain a laceration to her hand earlier on the date of admission and left without being seen in [facility], but was sent back secondary to the involuntary commitment.  She admitted to worsening alcohol intake over "the last couple of days".  She stated that she was under stress because she was unemployed, was unable to get a job during the coronavirus issues, and was having issues with her significant other as well as her daughter.  Her blood alcohol in the emergency department was 199.  She denied any previous psychiatric admissions, she denied any previous detox admissions, and stated she had seen a tele-psychiatrist at one time several years ago and a cardiologist who had recommended clonazepam or alprazolam.  She was admitted to the hospital for evaluation and stabilization.  Associated Signs/Symptoms: Depression Symptoms:   depressed mood, anhedonia, insomnia, psychomotor agitation, fatigue, feelings of worthlessness/guilt, difficulty concentrating, hopelessness, suicidal thoughts without plan, anxiety, loss of energy/fatigue, (Hypo) Manic Symptoms:  Impulsivity, Irritable Mood, Labiality of Mood, Anxiety Symptoms:  Excessive Worry, Psychotic Symptoms:  denied PTSD Symptoms: Negative Total Time spent with patient: 30 minutes  Past Psychiatric History: Patient stated that she had seen a psychiatrist via tele-video visit sometime in the past, but could not recall any treatment at that time.  She stated that she was also referred to a cardiologist who had prescribed clonazepam or alprazolam to her in the past.  She stated she had not had any previous psychiatric admissions or psychiatric evaluations or psychiatric treatment.  Is the patient at risk to self? Yes.    Has the patient been a risk to self in the past 6 months? Yes.    Has the patient been a risk to self within the distant past? No.  Is the patient a risk to others? No.  Has the patient been a risk to others in the past 6 months? No.  Has the patient been a risk to others within the distant past? No.   Prior Inpatient Therapy:   Prior Outpatient Therapy:    Alcohol Screening: 1. How often do you have a drink containing alcohol?: Monthly or less 2. How many drinks containing alcohol do you have on a typical day when you are drinking?: 3 or 4 3. How often do you have six or more drinks on one occasion?: Less than monthly AUDIT-C Score: 3 4. How often during the last year have you found that you were not able to stop drinking once you had started?: Never 5. How  often during the last year have you failed to do what was normally expected from you becasue of drinking?: Never 6. How often during the last year have you needed a first drink in the morning to get yourself going after a heavy drinking session?: Never 7. How often during the last year  have you had a feeling of guilt of remorse after drinking?: Never 8. How often during the last year have you been unable to remember what happened the night before because you had been drinking?: Never 9. Have you or someone else been injured as a result of your drinking?: No 10. Has a relative or friend or a doctor or another health worker been concerned about your drinking or suggested you cut down?: No Alcohol Use Disorder Identification Test Final Score (AUDIT): 3 Alcohol Brief Interventions/Follow-up: Continued Monitoring Substance Abuse History in the last 12 months:  Yes.   Consequences of Substance Abuse: Negative Previous Psychotropic Medications: Yes  Psychological Evaluations: No  Past Medical History:  Past Medical History:  Diagnosis Date  . Medical history non-contributory     Past Surgical History:  Procedure Laterality Date  . CHOLECYSTECTOMY     Family History:  Family History  Problem Relation Age of Onset  . Cancer Maternal Grandmother   . Suicidality Father   . Hypertension Brother    Family Psychiatric  History: Depression and substance abuse in her father Tobacco Screening: Have you used any form of tobacco in the last 30 days? (Cigarettes, Smokeless Tobacco, Cigars, and/or Pipes): Yes Tobacco use, Select all that apply: 5 or more cigarettes per day Are you interested in Tobacco Cessation Medications?: Yes, will notify MD for an order Counseled patient on smoking cessation including recognizing danger situations, developing coping skills and basic information about quitting provided: Yes Social History:  Social History   Substance and Sexual Activity  Alcohol Use Yes  . Alcohol/week: 3.0 standard drinks  . Types: 3 Cans of beer per week   Comment: sometimes     Social History   Substance and Sexual Activity  Drug Use Yes  . Types: Marijuana   Comment: every once in a while    Additional Social History:                            Allergies:  No Known Allergies Lab Results:  Results for orders placed or performed during the hospital encounter of 09/04/18 (from the past 48 hour(s))  Rapid urine drug screen (hospital performed)     Status: Abnormal   Collection Time: 09/04/18  1:46 AM  Result Value Ref Range   Opiates NONE DETECTED NONE DETECTED   Cocaine NONE DETECTED NONE DETECTED   Benzodiazepines POSITIVE (A) NONE DETECTED   Amphetamines NONE DETECTED NONE DETECTED   Tetrahydrocannabinol POSITIVE (A) NONE DETECTED   Barbiturates NONE DETECTED NONE DETECTED    Comment: (NOTE) DRUG SCREEN FOR MEDICAL PURPOSES ONLY.  IF CONFIRMATION IS NEEDED FOR ANY PURPOSE, NOTIFY LAB WITHIN 5 DAYS. LOWEST DETECTABLE LIMITS FOR URINE DRUG SCREEN Drug Class                     Cutoff (ng/mL) Amphetamine and metabolites    1000 Barbiturate and metabolites    200 Benzodiazepine                 200 Tricyclics and metabolites     300 Opiates and metabolites  300 Cocaine and metabolites        300 THC                            50 Performed at [facility], [address] [place], [place] [address]   Urinalysis, Routine w reflex microscopic     Status: Abnormal   Collection Time: 09/04/18  1:46 AM  Result Value Ref Range   Color, Urine COLORLESS (A) YELLOW   APPearance CLEAR CLEAR   Specific Gravity, Urine 1.000 (L) 1.005 - 1.030   pH 6.0 5.0 - 8.0   Glucose, UA NEGATIVE NEGATIVE mg/dL   Hgb urine dipstick SMALL (A) NEGATIVE   Bilirubin Urine NEGATIVE NEGATIVE   Ketones, ur NEGATIVE NEGATIVE mg/dL   Protein, ur NEGATIVE NEGATIVE mg/dL   Nitrite NEGATIVE NEGATIVE   Leukocytes,Ua TRACE (A) NEGATIVE   RBC / HPF 0-5 0 - 5 RBC/hpf   WBC, UA 0-5 0 - 5 WBC/hpf   Bacteria, UA NONE SEEN NONE SEEN   Squamous Epithelial / LPF 0-5 0 - 5   Ca Oxalate Crys, UA PRESENT     Comment: Performed at [facility], [address] [place], [place] [address]  Pregnancy, urine     Status: None   Collection Time: 09/04/18  1:46 AM   Result Value Ref Range   Preg Test, Ur NEGATIVE NEGATIVE    Comment:        THE SENSITIVITY OF THIS METHODOLOGY IS >20 mIU/mL. Performed at [facility], [address] [place], [place] [address]   CBC with Differential/Platelet     Status: Abnormal   Collection Time: 09/04/18  2:45 AM  Result Value Ref Range   WBC 8.4 4.0 - 10.5 K/uL   RBC 4.72 3.87 - 5.11 MIL/uL   Hemoglobin 16.5 (H) 12.0 - 15.0 g/dL   HCT 13.249.1 (H) 44.036.0 - 10.246.0 %   MCV 104.0 (H) 80.0 - 100.0 fL   MCH 35.0 (H) 26.0 - 34.0 pg   MCHC 33.6 30.0 - 36.0 g/dL   RDW 72.512.8 36.611.5 - 44.015.5 %   Platelets 355 150 - 400 K/uL   nRBC 0.0 0.0 - 0.2 %   Neutrophils Relative % 44 %   Neutro Abs 3.8 1.7 - 7.7 K/uL   Lymphocytes Relative 44 %   Lymphs Abs 3.7 0.7 - 4.0 K/uL   Monocytes Relative 9 %   Monocytes Absolute 0.7 0.1 - 1.0 K/uL   Eosinophils Relative 2 %   Eosinophils Absolute 0.2 0.0 - 0.5 K/uL   Basophils Relative 1 %   Basophils Absolute 0.1 0.0 - 0.1 K/uL   Immature Granulocytes 0 %   Abs Immature Granulocytes 0.02 0.00 - 0.07 K/uL    Comment: Performed at [facility], [address] [place], [place] [address]  Comprehensive metabolic panel     Status: Abnormal   Collection Time: 09/04/18  2:45 AM  Result Value Ref Range   Sodium 142 135 - 145 mmol/L   Potassium 3.5 3.5 - 5.1 mmol/L   Chloride 103 98 - 111 mmol/L   CO2 28 22 - 32 mmol/L   Glucose, Bld 89 70 - 99 mg/dL   BUN 5 (L) 6 - 20 mg/dL   Creatinine, Ser 5.950.71 0.44 - 1.00 mg/dL   Calcium 8.8 (L) 8.9 - 10.3 mg/dL   Total Protein 7.4 6.5 - 8.1 g/dL   Albumin 4.4 3.5 - 5.0 g/dL   AST 24  15 - 41 U/L   ALT 18 0 - 44 U/L   Alkaline Phosphatase 72 38 - 126 U/L   Total Bilirubin 0.5 0.3 - 1.2 mg/dL   GFR calc non Af Amer >60 >60 mL/min   GFR calc Af Amer >60 >60 mL/min   Anion gap 11 5 - 15    Comment: Performed at [facility], [address], [place], [place] [address]  Ethanol     Status: Abnormal   Collection Time: 09/04/18  2:45 AM  Result Value Ref  Range   Alcohol, Ethyl (B) 199 (H) <10 mg/dL    Comment: (NOTE) Lowest detectable limit for serum alcohol is 10 mg/dL. For medical purposes only. Performed at [facility], [address] [place], [place] [address]   Salicylate level     Status: None   Collection Time: 09/04/18  2:45 AM  Result Value Ref Range   Salicylate Lvl <7.0 2.8 - 30.0 mg/dL    Comment: Performed at [facility], [address], [place], [place] [address]  Acetaminophen level     Status: Abnormal   Collection Time: 09/04/18  2:45 AM  Result Value Ref Range   Acetaminophen (Tylenol), Serum <10 (L) 10 - 30 ug/mL    Comment: (NOTE) Therapeutic concentrations vary significantly. A range of 10-30 ug/mL  may be an effective concentration for many patients. However, some  are best treated at concentrations outside of this range. Acetaminophen concentrations >150 ug/mL at 4 hours after ingestion  and >50 ug/mL at 12 hours after ingestion are often associated with  toxic reactions. Performed at [facility], [address], [place], [place] [address]   SARS Coronavirus 2 (CEPHEID - Performed in [facility] hospital lab), Hosp Order     Status: None   Collection Time: 09/04/18 11:17 AM   Specimen: Nasopharyngeal Swab  Result Value Ref Range   SARS Coronavirus 2 NEGATIVE NEGATIVE    Comment: (NOTE) If result is NEGATIVE SARS-CoV-2 target nucleic acids are NOT DETECTED. The SARS-CoV-2 RNA is generally detectable in upper and lower  respiratory specimens during the acute phase of infection. The lowest  concentration of SARS-CoV-2 viral copies this assay can detect is 250  copies / mL. A negative result does not preclude SARS-CoV-2 infection  and should not be used as the sole basis for treatment or other  patient management decisions.  A negative result may occur with  improper specimen collection / handling, submission of specimen other  than nasopharyngeal swab, presence of viral mutation(s) within the  areas  targeted by this assay, and inadequate number of viral copies  (<250 copies / mL). A negative result must be combined with clinical  observations, patient history, and epidemiological information. If result is POSITIVE SARS-CoV-2 target nucleic acids are DETECTED. The SARS-CoV-2 RNA is generally detectable in upper and lower  respiratory specimens dur ing the acute phase of infection.  Positive  results are indicative of active infection with SARS-CoV-2.  Clinical  correlation with patient history and other diagnostic information is  necessary to determine patient infection status.  Positive results do  not rule out bacterial infection or co-infection with other viruses. If result is PRESUMPTIVE POSTIVE SARS-CoV-2 nucleic acids MAY BE PRESENT.   A presumptive positive result was obtained on the submitted specimen  and confirmed on repeat testing.  While 2019 novel coronavirus  (SARS-CoV-2) nucleic acids may be present in the submitted sample  additional confirmatory testing may be necessary for epidemiological  and / or  clinical management purposes  to differentiate between  SARS-CoV-2 and other Sarbecovirus currently known to infect humans.  If clinically indicated additional testing with an alternate test  methodology [phone]) is advised. The SARS-CoV-2 RNA is generally  detectable in upper and lower respiratory sp ecimens during the acute  phase of infection. The expected result is Negative. Fact Sheet for Patients:  StrictlyIdeas.no Fact Sheet for Healthcare Providers: BankingDealers.co.za This test is not yet approved or cleared by the [place] FDA and has been authorized for detection and/or diagnosis of SARS-CoV-2 by FDA under an Emergency Use Authorization (EUA).  This EUA will remain in effect (meaning this test can be used) for the duration of the COVID-19 declaration under Section 564(b)(1) of the Act, 21 U.S.C. section  360bbb-3(b)(1), unless the authorization is terminated or revoked sooner. Performed at [facility], [address] [place], [place] [address]     Blood Alcohol level:  Lab Results  Component Value Date   ETH 199 (H) 09/04/2018   ETH <5 16/96/7893    Metabolic Disorder Labs:  No results found for: HGBA1C, MPG No results found for: PROLACTIN No results found for: CHOL, TRIG, HDL, CHOLHDL, VLDL, LDLCALC  Current Medications: Current Facility-Administered Medications  Medication Dose Route Frequency Provider Last Rate Last Dose  . acetaminophen (TYLENOL) tablet 650 mg  650 mg Oral Q6H PRN Money, Lowry Ram, FNP   650 mg at 09/04/18 2005  . alum & mag hydroxide-simeth (MAALOX/MYLANTA) 200-200-20 MG/5ML suspension 30 mL  30 mL Oral Q4H PRN Money, Lowry Ram, FNP      . chlordiazePOXIDE (LIBRIUM) capsule 25 mg  25 mg Oral Q6H PRN Money, Lowry Ram, FNP   25 mg at 09/04/18 1853  . folic acid (FOLVITE) tablet 1 mg  1 mg Oral Daily Sharma Covert, MD      . hydrOXYzine (ATARAX/VISTARIL) tablet 25 mg  25 mg Oral Q6H PRN Money, Lowry Ram, FNP   25 mg at 09/04/18 1853  . hydrOXYzine (ATARAX/VISTARIL) tablet 25 mg  25 mg Oral TID PRN Money, Lowry Ram, FNP   25 mg at 09/05/18 0810  . loperamide (IMODIUM) capsule 2-4 mg  2-4 mg Oral PRN Money, Lowry Ram, FNP      . magnesium hydroxide (MILK OF MAGNESIA) suspension 30 mL  30 mL Oral Daily PRN Money, Lowry Ram, FNP      . multivitamin with minerals tablet 1 tablet  1 tablet Oral Daily Money, Lowry Ram, FNP   1 tablet at 09/05/18 0809  . neomycin-bacitracin-polymyxin (NEOSPORIN) ointment   Topical BID Sharma Covert, MD      . nicotine (NICODERM CQ - dosed in mg/24 hours) patch 21 mg  21 mg Transdermal Daily Sharma Covert, MD   21 mg at 09/05/18 0809  . ondansetron (ZOFRAN-ODT) disintegrating tablet 4 mg  4 mg Oral Q6H PRN Money, Lowry Ram, FNP      . thiamine (B-1) injection 100 mg  100 mg Intramuscular Once Money, Darnelle Maffucci B, FNP      . thiamine  (VITAMIN B-1) tablet 100 mg  100 mg Oral Daily Money, Lowry Ram, FNP   100 mg at 09/05/18 0809  . traZODone (DESYREL) tablet 50 mg  50 mg Oral QHS PRN Money, Lowry Ram, FNP   50 mg at 09/04/18 2113   PTA Medications: Medications Prior to Admission  Medication Sig Dispense Refill Last Dose  . acetaminophen (TYLENOL) 325 MG tablet Take 650 mg by mouth every 6 (six) hours as  needed for mild pain or headache.     Marland Kitchen. NORA-BE 0.35 MG tablet Take 1 tablet by mouth daily.   4     Musculoskeletal: Strength & Muscle Tone: within normal limits Gait & Station: normal Patient leans: N/A  Psychiatric Specialty Exam: Physical Exam  Nursing note and vitals reviewed. Constitutional: She is oriented to person, place, and time. She appears well-developed and well-nourished.  HENT:  Head: Normocephalic and atraumatic.  Respiratory: Effort normal.  Neurological: She is alert and oriented to person, place, and time.    ROS  Blood pressure 105/88, pulse (!) 136, temperature 97.7 F (36.5 C), temperature source Oral, resp. rate 18, height 5\' 6"  (1.676 m), weight 56.7 kg, SpO2 97 %.Body mass index is 20.18 kg/m.  General Appearance: Disheveled  Eye Contact:  Fair  Speech:  Normal Rate  Volume:  Increased  Mood:  Anxious, Depressed, Dysphoric and Irritable  Affect:  Congruent  Thought Process:  Coherent and Descriptions of Associations: Circumstantial  Orientation:  Full (Time, Place, and Person)  Thought Content:  Logical  Suicidal Thoughts:  No  Homicidal Thoughts:  No  Memory:  Immediate;   Fair Recent;   Fair Remote;   Fair  Judgement:  Impaired  Insight:  Lacking  Psychomotor Activity:  Increased  Concentration:  Concentration: Fair and Attention Span: Fair  Recall:  [employer]Fair  Fund of Knowledge:  Fair  Language:  Good  Akathisia:  Negative  Handed:  Right  AIMS (if indicated):     Assets:  Desire for Improvement Resilience  ADL's:  Intact  Cognition:  WNL  Sleep:  Number of Hours: 6.75     Treatment Plan Summary: Daily contact with patient to assess and evaluate symptoms and progress in treatment, Medication management and Plan : Patient is seen and examined.  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  She was admitted for evaluation and stabilization.  She will be integrated into the milieu.  She will be encouraged to attend groups and work on her coping skills.  She will be placed on the Librium detox protocol to where she will get 25 mg p.o. every 6 hours PRN a CIWA greater than 10.  She will also receive wound care on her right hand.  Review of her laboratories revealed normal liver function enzymes, but is significantly elevated MCV at 104.  Her pregnancy test was negative, her blood alcohol was 199, and drug screen was positive for marijuana as well as benzodiazepines.  She denies any issues with alcohol currently, and has limited insight about this.  Hopefully we can get her detox and headed in the right direction.  Observation Level/Precautions:  Detox 15 minute checks  Laboratory:  Chemistry Profile  Psychotherapy:    Medications:    Consultations:    Discharge Concerns:    Estimated LOS:  Other:     Physician Treatment Plan for Primary Diagnosis: <principal problem not specified> Long Term Goal(s): Improvement in symptoms so as ready for discharge  Short Term Goals: Ability to identify changes in lifestyle to reduce recurrence of condition will improve, Ability to verbalize feelings will improve, Ability to disclose and discuss suicidal ideas, Ability to demonstrate self-control will improve, Ability to identify and develop effective coping behaviors will improve, Ability to maintain clinical measurements within normal limits will improve and Ability to identify triggers associated with substance  abuse/mental health issues  will improve  Physician Treatment Plan for Secondary Diagnosis: Active Problems:   MDD (major depressive disorder), severe (HCC)  Long Term Goal(s): Improvement in symptoms so as ready for discharge  Short Term Goals: Ability to identify changes in lifestyle to reduce recurrence of condition will improve, Ability to verbalize feelings will improve, Ability to disclose and discuss suicidal ideas, Ability to demonstrate self-control will improve, Ability to identify and develop effective coping behaviors will improve, Ability to maintain clinical measurements within normal limits will improve and Ability to identify triggers associated with substance abuse/mental health issues will improve  I certify that inpatient services furnished can reasonably be expected to improve the patient's condition.    Antonieta Pert, MD 6/19/20209:41 AM

## 2018-09-05 NOTE — [facility] Suicide Risk Assessment (Signed)
[facility] Admission Suicide Risk Assessment   Nursing information obtained from:  Patient Demographic factors:  Caucasian Current Mental Status:  NA Loss Factors:  NA Historical Factors:  Impulsivity Risk Reduction Factors:  NA  Total Time spent with patient: 30 minutes Principal Problem: <principal problem not specified> Diagnosis:  Active Problems:   MDD (major depressive disorder), severe (HCC)  Subjective Data: Patient is seen and examined.  Patient is a 43 year old female who was brought to the [facility] emergency department on 09/04/2018 under involuntary commitment.  The patient stated that there was a misunderstanding, and that she cut herself on her right hand while cooking, but her boyfriend brought her to the emergency department secondary to her self injury as well as excessive alcohol usage.  There was significant worry that this was a suicide attempt or at least self-harm.  The patient denies any suicidal ideation and stated that she just mainly has "anxiety".  Collateral information obtained from her daughter in the emergency department stated that the patient had been threatening suicide for several years and had been cutting herself.  She did sustain a laceration to her hand earlier on the date of admission and left without being seen in [facility], but was sent back secondary to the involuntary commitment.  She admitted to worsening alcohol intake over "the last couple of days".  She stated that she was under stress because she was unemployed, was unable to get a job during the coronavirus issues, and was having issues with her significant other as well as her daughter.  Her blood alcohol in the emergency department was 199.  She denied any previous psychiatric admissions, she denied any previous detox admissions, and stated she had seen a tele-psychiatrist at one time several years ago and a cardiologist who had recommended clonazepam or alprazolam.  She was admitted to the hospital for  evaluation and stabilization.  Continued Clinical Symptoms:  Alcohol Use Disorder Identification Test Final Score (AUDIT): 3 The "Alcohol Use Disorders Identification Test", Guidelines for Use in Primary Care, Second Edition.  World Pharmacologist [facility]). Score between 0-7:  no or low risk or alcohol related problems. Score between 8-15:  moderate risk of alcohol related problems. Score between 16-19:  high risk of alcohol related problems. Score 20 or above:  warrants further diagnostic evaluation for alcohol dependence and treatment.   CLINICAL FACTORS:   Severe Anxiety and/or Agitation Depression:   Anhedonia Comorbid alcohol abuse/dependence Hopelessness Impulsivity Insomnia Alcohol/Substance Abuse/Dependencies   Musculoskeletal: Strength & Muscle Tone: within normal limits Gait & Station: normal Patient leans: N/A  Psychiatric Specialty Exam: Physical Exam  ROS  Blood pressure 105/88, pulse (!) 136, temperature 97.7 F (36.5 C), temperature source Oral, resp. rate 18, height 5\' 6"  (1.676 m), weight 56.7 kg, SpO2 97 %.Body mass index is 20.18 kg/m.  General Appearance: Disheveled  Eye Contact:  Fair  Speech:  Normal Rate  Volume:  Increased  Mood:  Anxious, Depressed, Dysphoric and Irritable  Affect:  Congruent  Thought Process:  Coherent and Descriptions of Associations: Circumstantial  Orientation:  Full (Time, Place, and Person)  Thought Content:  Logical  Suicidal Thoughts:  No  Homicidal Thoughts:  No  Memory:  Immediate;   Fair Recent;   Fair Remote;   Fair  Judgement:  Impaired  Insight:  Lacking  Psychomotor Activity:  Increased  Concentration:  Concentration: Fair and Attention Span: Fair  Recall:  [employer] of Knowledge:  Fair  Language:  Good  Akathisia:  Negative  Handed:  Right  AIMS (if indicated):     Assets:  Desire for Improvement Resilience  ADL's:  Intact  Cognition:  WNL  Sleep:  Number of Hours: 6.75      COGNITIVE  FEATURES THAT CONTRIBUTE TO RISK:  None    SUICIDE RISK:   Mild:  Suicidal ideation of limited frequency, intensity, duration, and specificity.  There are no identifiable plans, no associated intent, mild dysphoria and related symptoms, good self-control (both objective and subjective assessment), few other risk factors, and identifiable protective factors, including available and accessible social support.  PLAN OF CARE: Patient is seen and examined.  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  She was admitted for evaluation and stabilization.  She will be integrated into the milieu.  She will be encouraged to attend groups and work on her coping skills.  She will be placed on the [facility] detox protocol to where she will get 25 mg p.o. every 6 hours PRN a CIWA greater than 10.  She will also receive wound care on her right hand.  Review of her laboratories revealed normal liver function enzymes, but is significantly elevated MCV at 104.  Her pregnancy test was negative, her blood alcohol was 199, and drug screen was positive for marijuana as well as benzodiazepines.  She denies any issues with alcohol currently, and has limited insight about this.  Hopefully we can get her detox and headed in the right direction.  I certify that inpatient services furnished can reasonably be expected to improve the patient's condition.   Antonieta PertGreg Lawson Clary, MD 09/05/2018, 8:26 AM

## 2018-09-05 NOTE — Progress Notes (Signed)
Recreation Therapy Notes  Date:  6.19.20 Time: 0930 Location: 300 Hall Dayroom  Group Topic: Stress Management  Goal Area(s) Addresses:  Patient will identify positive stress management techniques. Patient will identify benefits of using stress management post d/c.  Behavioral Response:  Engaged  Intervention: Stress Management  Activity :  Progressive Muscle Relaxation.  LRT introduced the stress management technique of progressive muscle relaxation.  LRT lead patients in tensing each muscle group individually then releasing it.  Patients were to follow along as LRT lead them through the exercise.  Education:  Stress Management, Discharge Planning.   Education Outcome: Acknowledges Education  Clinical Observations/Feedback: Pt attended and participated in group session.     Victorino Sparrow, LRT/CTRS    Ria Comment, [place] A 09/05/2018 11:00 AM

## 2018-09-05 NOTE — Tx Team (Signed)
Interdisciplinary Treatment and Diagnostic Plan Update  09/05/2018 Time of Session: 9:00am Ana Jackson MRN: [id number]  Principal Diagnosis: <principal problem not specified>  Secondary Diagnoses: Active Problems:   MDD (major depressive disorder), severe (HCC)   Current Medications:  Current Facility-Administered Medications  Medication Dose Route Frequency Provider Last Rate Last Dose  . acetaminophen (TYLENOL) tablet 650 mg  650 mg Oral Q6H PRN Money, Lowry Ram, FNP   650 mg at 09/04/18 2005  . alum & mag hydroxide-simeth (MAALOX/MYLANTA) 200-200-20 MG/5ML suspension 30 mL  30 mL Oral Q4H PRN Money, Lowry Ram, FNP      . chlordiazePOXIDE (LIBRIUM) capsule 25 mg  25 mg Oral Q6H PRN Money, Lowry Ram, FNP   25 mg at 09/04/18 1853  . folic acid (FOLVITE) tablet 1 mg  1 mg Oral Daily Sharma Covert, MD      . hydrOXYzine (ATARAX/VISTARIL) tablet 25 mg  25 mg Oral Q6H PRN Money, Lowry Ram, FNP   25 mg at 09/04/18 1853  . hydrOXYzine (ATARAX/VISTARIL) tablet 25 mg  25 mg Oral TID PRN Money, Lowry Ram, FNP   25 mg at 09/05/18 0810  . loperamide (IMODIUM) capsule 2-4 mg  2-4 mg Oral PRN Money, Lowry Ram, FNP      . magnesium hydroxide (MILK OF MAGNESIA) suspension 30 mL  30 mL Oral Daily PRN Money, Lowry Ram, FNP      . multivitamin with minerals tablet 1 tablet  1 tablet Oral Daily Money, Lowry Ram, FNP   1 tablet at 09/05/18 0809  . neomycin-bacitracin-polymyxin (NEOSPORIN) ointment   Topical BID Sharma Covert, MD      . nicotine (NICODERM CQ - dosed in mg/24 hours) patch 21 mg  21 mg Transdermal Daily Sharma Covert, MD   21 mg at 09/05/18 0809  . ondansetron (ZOFRAN-ODT) disintegrating tablet 4 mg  4 mg Oral Q6H PRN Money, Lowry Ram, FNP      . thiamine (B-1) injection 100 mg  100 mg Intramuscular Once Money, Darnelle Maffucci B, FNP      . thiamine (VITAMIN B-1) tablet 100 mg  100 mg Oral Daily Money, Lowry Ram, FNP   100 mg at 09/05/18 0809  . traZODone (DESYREL) tablet 50 mg  50 mg Oral  QHS PRN Money, Lowry Ram, FNP   50 mg at 09/04/18 2113   PTA Medications: Medications Prior to Admission  Medication Sig Dispense Refill Last Dose  . acetaminophen (TYLENOL) 325 MG tablet Take 650 mg by mouth every 6 (six) hours as needed for mild pain or headache.     Marland Kitchen NORA-BE 0.35 MG tablet Take 1 tablet by mouth daily.   4     Patient Stressors:    Patient Strengths:    Treatment Modalities: Medication Management, Group therapy, Case management,  1 to 1 session with clinician, Psychoeducation, Recreational therapy.   Physician Treatment Plan for Primary Diagnosis: <principal problem not specified> Long Term Goal(s): Improvement in symptoms so as ready for discharge Improvement in symptoms so as ready for discharge   Short Term Goals: Ability to identify changes in lifestyle to reduce recurrence of condition will improve Ability to verbalize feelings will improve Ability to disclose and discuss suicidal ideas Ability to demonstrate self-control will improve Ability to identify and develop effective coping behaviors will improve Ability to maintain clinical measurements within normal limits will improve Ability to identify triggers associated with substance abuse/mental health issues will improve Ability to identify changes in lifestyle to reduce recurrence  of condition will improve Ability to verbalize feelings will improve Ability to disclose and discuss suicidal ideas Ability to demonstrate self-control will improve Ability to identify and develop effective coping behaviors will improve Ability to maintain clinical measurements within normal limits will improve Ability to identify triggers associated with substance abuse/mental health issues will improve  Medication Management: Evaluate patient's response, side effects, and tolerance of medication regimen.  Therapeutic Interventions: 1 to 1 sessions, Unit Group sessions and Medication administration.  Evaluation of  Outcomes: Not Met  Physician Treatment Plan for Secondary Diagnosis: Active Problems:   MDD (major depressive disorder), severe ([place])  Long Term Goal(s): Improvement in symptoms so as ready for discharge Improvement in symptoms so as ready for discharge   Short Term Goals: Ability to identify changes in lifestyle to reduce recurrence of condition will improve Ability to verbalize feelings will improve Ability to disclose and discuss suicidal ideas Ability to demonstrate self-control will improve Ability to identify and develop effective coping behaviors will improve Ability to maintain clinical measurements within normal limits will improve Ability to identify triggers associated with substance abuse/mental health issues will improve Ability to identify changes in lifestyle to reduce recurrence of condition will improve Ability to verbalize feelings will improve Ability to disclose and discuss suicidal ideas Ability to demonstrate self-control will improve Ability to identify and develop effective coping behaviors will improve Ability to maintain clinical measurements within normal limits will improve Ability to identify triggers associated with substance abuse/mental health issues will improve     Medication Management: Evaluate patient's response, side effects, and tolerance of medication regimen.  Therapeutic Interventions: 1 to 1 sessions, Unit Group sessions and Medication administration.  Evaluation of Outcomes: Not Met   RN Treatment Plan for Primary Diagnosis: <principal problem not specified> Long Term Goal(s): Knowledge of disease and therapeutic regimen to maintain health will improve  Short Term Goals: Ability to remain free from injury will improve, Ability to identify and develop effective coping behaviors will improve and Compliance with prescribed medications will improve  Medication Management: RN will administer medications as ordered by provider, will assess and  evaluate patient's response and provide education to patient for prescribed medication. RN will report any adverse and/or side effects to prescribing provider.  Therapeutic Interventions: 1 on 1 counseling sessions, Psychoeducation, Medication administration, Evaluate responses to treatment, Monitor vital signs and CBGs as ordered, Perform/monitor CIWA, COWS, AIMS and Fall Risk screenings as ordered, Perform wound care treatments as ordered.  Evaluation of Outcomes: Not Met   LCSW Treatment Plan for Primary Diagnosis: <principal problem not specified> Long Term Goal(s): Safe transition to appropriate next level of care at discharge, Engage patient in therapeutic group addressing interpersonal concerns.  Short Term Goals: Engage patient in aftercare planning with referrals and resources, Increase social support, Identify triggers associated with mental health/substance abuse issues and Increase skills for wellness and recovery  Therapeutic Interventions: Assess for all discharge needs, 1 to 1 time with Social worker, Explore available resources and support systems, Assess for adequacy in community support network, Educate family and significant other(s) on suicide prevention, Complete Psychosocial Assessment, Interpersonal group therapy.  Evaluation of Outcomes: Not Met   Progress in Treatment: Attending groups: No. Participating in groups: No. Taking medication as prescribed: Yes. Toleration medication: Yes. Family/Significant other contact made: No, will contact:  supports if consents are granted Patient understands diagnosis: No. Discussing patient identified problems/goals with staff: Yes. Medical problems stabilized or resolved: Yes. Denies suicidal/homicidal ideation: Yes. Issues/concerns per patient self-inventory: No.  New problem(s) identified: Yes, Describe:  limited insight, limited social supports  New Short Term/Long Term Goal(s): detox, medication management for mood  stabilization; elimination of SI thoughts; development of comprehensive mental wellness/sobriety plan.  Patient Goals:  discharge  Discharge Plan or Barriers: CSW continuing to assess for appropriate referrals  Reason for Continuation of Hospitalization: Anxiety Depression Withdrawal symptoms  Estimated Length of Stay: 3-5 days  Attendees: Patient: 09/05/2018 10:37 AM  Physician:  09/05/2018 10:37 AM  Nursing:  09/05/2018 10:37 AM  RN Care Manager: 09/05/2018 10:37 AM  Social Worker: Stephanie Acre, [place] 09/05/2018 10:37 AM  Recreational Therapist:  09/05/2018 10:37 AM  Other:  09/05/2018 10:37 AM  Other:  09/05/2018 10:37 AM  Other: 09/05/2018 10:37 AM    Scribe for Treatment Team: Joellen [place], [place] 09/05/2018 10:37 AM

## 2018-09-05 NOTE — Progress Notes (Signed)
D Pt is observed OOB UAL on the 300 hall today. SHe toelrates this fairly well. She wers her own clothes. Her voice is loud and intrusive. SHe interjects frequently into other patient's conversations. Talks loudly to others while she is taking her am meds. Her affect is depressed, tearful, labile and flat.      A She completed her daily assessment this am and on this she wrote she deneid SI today and she rated her depression, hopelessness and anxiety " 0/0/5-6", respectively. She said " I was cooking" when Probation officer asked her how she cut her little finger and received stitches in it. Wound care is performed by this writer, stitches are intact and there is no drainage and / or bleeding from wound. Writer observed no signs of infection ( pus, redness, heat, smell,and/or draingae).  Wound approx I inch long. Skin cleaned with sterile normal saline and then  Blotted dry with sterile gauze and dry telfa applied and secured with plastic tape. Pt tolerated procedure well. Pt instructed by this writer to keep her hand dry and keep hands off wound.Pt verbalized understanding and willingness to comply.     R Safety in place.

## 2018-09-06 MED ORDER — NON FORMULARY: Freq: Every day | Status: DC

## 2018-09-06 MED ORDER — NORETHINDRONE 0.35 MG PO TABS: 1.0000 | ORAL_TABLET | Freq: Every day | ORAL | Status: DC

## 2018-09-06 MED ORDER — MIRTAZAPINE 7.5 MG PO TABS
7.5000 mg | ORAL_TABLET | Freq: Every day | ORAL | Status: DC
  Filled 2018-09-06: qty 1

## 2018-09-06 MED ORDER — TRAZODONE HCL 50 MG PO TABS
50.0000 mg | ORAL_TABLET | Freq: Every evening | ORAL | Status: DC | PRN
  Administered 2018-09-06: 01:00:00 50 mg via ORAL
  Filled 2018-09-06 (×4): qty 1

## 2018-09-06 MED ORDER — TRAZODONE HCL 50 MG PO TABS
50.0000 mg | ORAL_TABLET | Freq: Every evening | ORAL | Status: DC | PRN
  Administered 2018-09-06 (×2): 50 mg via ORAL
  Filled 2018-09-06: qty 1
  Filled 2018-09-06: qty 14
  Filled 2018-09-06 (×4): qty 1
  Filled 2018-09-06: qty 14

## 2018-09-06 MED ORDER — TRAZODONE HCL 50 MG PO TABS: 50.0000 mg | ORAL_TABLET | Freq: Every evening | ORAL | Status: DC | PRN

## 2018-09-06 NOTE — [facility] Group Notes (Signed)
[facility] Group Notes:  (Nursing)  Date:  09/06/2018  Time: 130 PM Type of Therapy:  Nurse Education  Participation Level:  Active  Participation Quality:  Attentive  Affect:  Appropriate  Cognitive:  Alert  Insight:  Appropriate  Engagement in Group:  Engaged  Modes of Intervention:  Discussion and Education  Summary of Progress/Problems: Group played a non competitive learning, communication game that fosters listening skills as well as self expression  Waymond Cera 09/06/2018, 3:17 PM
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
## 2018-09-06 NOTE — Plan of Care (Signed)
  Problem: Education: Goal: Knowledge of [place] [facility] Education information/materials will improve Outcome: Progressing   

## 2018-09-06 NOTE — Progress Notes (Signed)
[place] NOVEL CORONAVIRUS (COVID-19) DAILY CHECK-OFF SYMPTOMS - answer yes or no to each - every day NO YES  Have you had a fever in the past 24 hours?  . Fever (Temp > 37.80C / 100F) X   Have you had any of these symptoms in the past 24 hours? . New Cough .  Sore Throat  .  Shortness of Breath .  Difficulty Breathing .  Unexplained Body Aches   X   Have you had any one of these symptoms in the past 24 hours not related to allergies?   . Runny Nose .  Nasal Congestion .  Sneezing   X   If you have had runny nose, nasal congestion, sneezing in the past 24 hours, has it worsened?  X   EXPOSURES - check yes or no X   Have you traveled outside the state in the past 14 days?  X   Have you been in contact with someone with a confirmed diagnosis of COVID-19 or PUI in the past 14 days without wearing appropriate PPE?  X   Have you been living in the same home as a person with confirmed diagnosis of COVID-19 or a PUI (household contact)?    X   Have you been diagnosed with COVID-19?    X              What to do next: Answered NO to all: Answered YES to anything:   Proceed with unit schedule Follow the [facility] Inpatient Flowsheet.   

## 2018-09-06 NOTE — Progress Notes (Signed)
The patient's positive event for the day is that she met with her case manager. She also shared that she attended more of her groups today as compared to yesterday. Her goal for tomorrow is to have a better day and to get discharged.

## 2018-09-06 NOTE — Progress Notes (Signed)
D: Pt was in dayroom upon initial approach.  Pt presents with anxious affect and mood.  She brightens with interaction.  Pt describes her day as "pretty good" and reports "I'm feeling a whole lot better."  Her goal is to "sleep good."  Pt denies SI/HI, denies hallucinations, reports pain in 5th digit of R hand.  Pt has been loud at times tonight, but easily redirectable.  Pt has been visible in milieu interacting with peers and staff appropriately.   A: Introduced self to pt.  Met with pt 1:1.  Actively listened to pt and offered support and encouragement.  Medications administered per order.  PRN medication administered for pain and anxiety.  15 minute safety checks in place.  R: Pt is safe on the unit.  Pt is compliant with medications.  Pt verbally contracts for safety.  Will continue to monitor and assess.
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
## 2018-09-07 MED ORDER — NICOTINE 21 MG/24HR TD PT24: 21.0000 mg | MEDICATED_PATCH | Freq: Every day | TRANSDERMAL | 0 refills | Status: AC

## 2018-09-07 MED ORDER — TRAZODONE HCL 50 MG PO TABS: 50.0000 mg | ORAL_TABLET | Freq: Every evening | ORAL | 0 refills | Status: AC | PRN

## 2018-09-07 NOTE — [facility] Suicide Risk Assessment (Signed)
[facility] Discharge Suicide Risk Assessment   Principal Problem: MDD (major depressive disorder), severe ([place]) Discharge Diagnoses: Principal Problem:   MDD (major depressive disorder), severe ([place])   Total Time spent with patient: 15 minutes  Musculoskeletal: Strength & Muscle Tone: within normal limits Gait & Station: normal Patient leans: N/A  Psychiatric Specialty Exam: Review of Systems  All other systems reviewed and are negative.   Blood pressure 104/87, pulse 88, temperature 97.9 F (36.6 C), temperature source Oral, resp. rate 20, height 5\' 6"  (1.676 m), weight 56.7 kg, SpO2 100 %.Body mass index is 20.18 kg/m.  General Appearance: Casual  Eye Contact::  Good  Speech:  Normal Rate409  Volume:  Normal  Mood:  Euthymic  Affect:  Congruent  Thought Process:  Coherent and Descriptions of Associations: Intact  Orientation:  Full (Time, Place, and Person)  Thought Content:  Logical  Suicidal Thoughts:  No  Homicidal Thoughts:  No  Memory:  Immediate;   Fair Recent;   Fair Remote;   Fair  Judgement:  Intact  Insight:  Fair  Psychomotor Activity:  Normal  Concentration:  Fair  Recall:  [employer] of Knowledge:Good  Language: Good  Akathisia:  Negative  Handed:  Right  AIMS (if indicated):     Assets:  Desire for Improvement Resilience  Sleep:  Number of Hours: 6  Cognition: WNL  ADL's:  Intact   Mental Status Per Nursing Assessment::   On Admission:  NA  Demographic Factors:  Divorced or widowed and Caucasian  Loss Factors: NA  Historical Factors: Impulsivity  Risk Reduction Factors:   Living with another person, especially a relative  Continued Clinical Symptoms:  Alcohol/Substance Abuse/Dependencies  Cognitive Features That Contribute To Risk:  None    Suicide Risk:  Minimal: No identifiable suicidal ideation.  Patients presenting with no risk factors but with morbid ruminations; may be classified as minimal risk based on the severity of the  depressive symptoms  Follow-up Information    [facility] Follow up on 09/18/2018.   Why: Hospital discharge with [place] appointment is Thursday, 7/2 at 3:00p.  Please wear a mask, bring your photo ID, current medications and discharge paperwork from this hospitalization.  Contact information: [place] [address] [phone]           Plan Of Care/Follow-up recommendations:  Activity:  ad lib  Sharma Covert, MD 09/07/2018, 7:50 AM

## 2018-09-07 NOTE — BHH Group Notes (Signed)
[place] LCSW Group Therapy Note  09/07/2018   10:00-11:00AM  Type of Therapy and Topic:  Group Therapy:  Unhealthy versus Healthy Supports, Which Am I?  Participation Level:  Active   Description of Group:  Patients in this group were introduced to the concept that additional supports including self-support are an essential part of recovery.  Initially a discussion was held about the differences between healthy versus unhealthy supports.  Patients were asked to share what unhealthy supports in their lives need to be addressed, as well as what additional healthy supports could be added for greater help in reaching their goals.   A song entitled "My Own Hero" was played and a group discussion ensued in which patients stated they could relate to the song and it inspired them to realize they have be willing to help themselves in order to succeed, because other people cannot achieve sobriety or stability for them.  We discussed adding a variety of healthy supports to address the various needs in patient lives, including becoming more self-supportive.  A song was played called "I Am Enough" toward the end of group and used to conduct an inspirational wrap-up to group.  Therapeutic Goals: 1)  Highlight the differences between healthy and unhealthy supports 2)  Suggest the importance of being a part of one's own support system 2)  Discuss reasons people in one's life may eventually be unable to be continually supportive  3)  Identify the patient's current support system   4) Elicit commitments to add healthy supports and to become more conscious of being self-supportive   Summary of Patient Progress:  The patient listed as healthy supports the following:  Her friend Levada Dy and her daughter.  The patient expressed that unhealthy supports to be addressed include her mother and herself.  Patient feels they are an unhealthy support for themselves.  Healthy supports which could be added for increased stability and  happiness include professional supports.  She states that she has real trust issues so it is hard for her to let anybody in.  Therapeutic Modalities:   Motivational Interviewing Activity  Maretta Los , MSW, LCSW

## 2018-09-07 NOTE — Progress Notes (Signed)
Pt completed her daily assessment and on this she wrote she denied having SI today and +she rated her depression, hopelessness and anxeity " 3-4/0/3-4", respectively. Her dc instructions are reviewed with her, she demonstrates verbal understanding and willingness to comply. She is given cc of these ( SRA, AVS, SSP and transition record) and then all belongings are returned to her from her locker. She is then escorted to bldg entrance and dc'd amb to home per MD order.

## 2018-09-07 NOTE — Progress Notes (Signed)
  [facility] Adult Case Management Discharge Plan :  Will you be returning to the same living situation after discharge:  Yes,  back to her home alone with dog At discharge, do you have transportation home?: Yes,  patient is arranging and friend Tally Due states she can transport if needed Do you have the ability to pay for your medications: No.  No income, no insurance - has been referred to local agency for assistance.  Release of information consent forms completed and turned in to Medical Records by CSW.   Patient to Follow up at: Follow-up Information    [facility] Follow up on 09/18/2018.   Why: Hospital discharge with [place] appointment is Thursday, 7/2 at 3:00p.  Please wear a mask, bring your photo ID, current medications and discharge paperwork from this hospitalization.  Contact information: [address] [place] [place] [address] [phone]           Next level of care provider has access to [place] and Suicide Prevention discussed: Yes,  with friend Tally Due  Have you used any form of tobacco in the last 30 days? (Cigarettes, Smokeless Tobacco, Cigars, and/or Pipes): Yes  Has patient been referred to the Quitline?: Patient refused referral  Patient has been referred for addiction treatment: Yes  Maretta Los, LCSW 09/07/2018, 8:19 AM

## 2018-09-07 NOTE — Plan of Care (Signed)
  Problem: Education: Goal: Knowledge of [place] [facility] Education information/materials will improve Outcome: Progressing   

## 2018-09-07 NOTE — [facility] Suicide Risk Assessment (Signed)
[facility] INPATIENT:  Family/Significant Other Suicide Prevention Education  Suicide Prevention Education:  Education Completed; Tally Due, friend, [phone],  (name of family member/significant other) has been identified by the patient as the family member/significant other with whom the patient will be residing, and identified as the person(s) who will aid the patient in the event of a mental health crisis (suicidal ideations/suicide attempt).  With written consent from the patient, the family member/significant other has been provided the following suicide prevention education, prior to the and/or following the discharge of the patient.  Ms. Owens Shark stated she is familiar with suicide prevention information already, so it was reviewed briefly instead of in detail.  She stated that patient sounds better every day.  If patient cannot find a ride to help her get home, she can call Ms. Owens Shark who is available to provide transportation.  The suicide prevention education provided includes the following:  Suicide risk factors  Suicide prevention and interventions  National Suicide Hotline telephone number  [facility] assessment telephone number  [facility] Emergency Assistance [place] and/or Residential Mobile Crisis Unit telephone number  Request made of family/significant other to:  Remove weapons (e.g., guns, rifles, knives), all items previously/currently identified as safety concern.    Remove drugs/medications (over-the-counter, prescriptions, illicit drugs), all items previously/currently identified as a safety concern.  The family member/significant other verbalizes understanding of the suicide prevention education information provided.  The family member/significant other agrees to remove the items of safety concern listed above.  Berlin Hun Grossman-Orr 09/07/2018, 8:18 AM

## 2018-09-07 NOTE — Progress Notes (Signed)
[place] NOVEL CORONAVIRUS (COVID-19) DAILY CHECK-OFF SYMPTOMS - answer yes or no to each - every day NO YES  Have you had a fever in the past 24 hours?  . Fever (Temp > 37.80C / 100F) X   Have you had any of these symptoms in the past 24 hours? . New Cough .  Sore Throat  .  Shortness of Breath .  Difficulty Breathing .  Unexplained Body Aches   X   Have you had any one of these symptoms in the past 24 hours not related to allergies?   . Runny Nose .  Nasal Congestion .  Sneezing   X   If you have had runny nose, nasal congestion, sneezing in the past 24 hours, has it worsened?  X   EXPOSURES - check yes or no X   Have you traveled outside the state in the past 14 days?  X   Have you been in contact with someone with a confirmed diagnosis of COVID-19 or PUI in the past 14 days without wearing appropriate PPE?  X   Have you been living in the same home as a person with confirmed diagnosis of COVID-19 or a PUI (household contact)?    X   Have you been diagnosed with COVID-19?    X              What to do next: Answered NO to all: Answered YES to anything:   Proceed with unit schedule Follow the [facility] Inpatient Flowsheet.   
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
## 2022-08-24 ENCOUNTER — Encounter (HOSPITAL_COMMUNITY): Payer: Self-pay

## 2022-08-24 ENCOUNTER — Emergency Department (HOSPITAL_COMMUNITY)
Admission: EM | Admit: 2022-08-24 | Discharge: 2022-08-24 | Disposition: A | Discharge status: Home / Self Care | Payer: Medicaid Other | Attending: Emergency Medicine | Admitting: Emergency Medicine

## 2022-08-24 ENCOUNTER — Other Ambulatory Visit: Payer: Self-pay

## 2022-08-24 DIAGNOSIS — L02213 Cutaneous abscess of chest wall: Secondary | ICD-10-CM | POA: Diagnosis present

## 2022-08-24 LAB — POC URINE PREG, ED: Preg Test, Ur: NEGATIVE

## 2022-08-24 MED ORDER — DOXYCYCLINE HYCLATE 100 MG PO CAPS: 100.0000 mg | ORAL_CAPSULE | Freq: Two times a day (BID) | ORAL | 0 refills | Status: DC

## 2022-08-24 MED ORDER — DOXYCYCLINE HYCLATE 100 MG PO TABS
100.0000 mg | ORAL_TABLET | Freq: Once | ORAL | Status: AC
  Administered 2022-08-24: 100 mg via ORAL
  Filled 2022-08-24: qty 1

## 2022-08-24 MED ORDER — HYDROCODONE-ACETAMINOPHEN 5-325 MG PO TABS: 1.0000 | ORAL_TABLET | Freq: Four times a day (QID) | ORAL | 0 refills | Status: DC | PRN

## 2022-08-24 MED ORDER — LIDOCAINE HCL (PF) 1 % IJ SOLN
30.0000 mL | Freq: Once | INTRAMUSCULAR | Status: AC
  Administered 2022-08-24: 30 mL
  Filled 2022-08-24: qty 30

## 2022-08-24 MED ORDER — HYDROCODONE-ACETAMINOPHEN 5-325 MG PO TABS
1.0000 | ORAL_TABLET | Freq: Once | ORAL | Status: AC
  Administered 2022-08-24: 1 via ORAL
  Filled 2022-08-24: qty 1

## 2022-08-24 MED ORDER — DOXYCYCLINE HYCLATE 100 MG PO CAPS: 100.0000 mg | ORAL_CAPSULE | Freq: Two times a day (BID) | ORAL | 0 refills | Status: AC

## 2022-08-24 NOTE — ED Provider Notes (Signed)
EMERGENCY DEPARTMENT AT [facility] Provider Note   CSN: [id number] Arrival date & time: 08/24/22  1146     History  Chief Complaint  Patient presents with   Abscess    Ana Jackson is a 47 y.o. female.  She reports past medical history of a cyst on the right rib cage that has been there for many years but over the past 4 days it has become red inflamed and painful.  Denies fevers or chills.  She has been applying warm compresses at home.  She does not have any drainage from the area.  She tried some Tylenol Motrin to help with the pain.   Abscess      Home Medications Prior to Admission medications   Medication Sig Start Date End Date Taking? Authorizing Provider  doxycycline (VIBRAMYCIN) 100 MG capsule Take 1 capsule (100 mg total) by mouth 2 (two) times daily for 7 days. 08/24/22 08/31/22  Carmel Sacramento A, PA-C  HYDROcodone-acetaminophen (NORCO) 5-325 MG tablet Take 1 tablet by mouth every 6 (six) hours as needed for moderate pain. 08/24/22   Carmel Sacramento A, PA-C  nicotine (NICODERM CQ - DOSED IN MG/24 HOURS) 21 mg/24hr patch Place 1 patch (21 mg total) onto the skin daily. 09/07/18   Oneta Rack, NP  NORA-BE 0.35 MG tablet Take 1 tablet by mouth daily.  09/03/17   [provider]  traZODone (DESYREL) 50 MG tablet Take 1 tablet (50 mg total) by mouth at bedtime and may repeat dose one time if needed. 09/07/18   Oneta Rack, NP      Allergies    Patient has no known allergies.    Review of Systems   Review of Systems  Physical Exam Updated Vital Signs BP 122/70   Pulse 72   Temp 98.8 F (37.1 C)   Resp 18   Ht 5\' 6"  (1.676 m)   Wt 56.7 kg   SpO2 98%   BMI 20.18 kg/m  Physical Exam Vitals and nursing note reviewed.  Constitutional:      General: She is not in acute distress.    Appearance: She is well-developed.  HENT:     Head: Normocephalic and atraumatic.     Mouth/Throat:     Mouth: Mucous membranes are moist.   Eyes:     Conjunctiva/sclera: Conjunctivae normal.  Cardiovascular:     Rate and Rhythm: Normal rate and regular rhythm.     Heart sounds: No murmur heard. Pulmonary:     Effort: Pulmonary effort is normal. No respiratory distress.     Breath sounds: Normal breath sounds.  Abdominal:     Palpations: Abdomen is soft.     Tenderness: There is no abdominal tenderness.  Musculoskeletal:        General: No swelling.     Cervical back: Neck supple.  Skin:    General: Skin is warm and dry.     Capillary Refill: Capillary refill takes less than 2 seconds.     Comments: As noted in the picture there is an erythematous indurated area in the right lateral chest wall area.  There is no crepitus.  The central area that is white   Neurological:     General: No focal deficit present.     Mental Status: She is alert and oriented to person, place, and time.  Psychiatric:        Mood and Affect: Mood normal.     ED Results / Procedures /  Treatments   Labs (all labs ordered are listed, but only abnormal results are displayed) Labs Reviewed  POC URINE PREG, ED    EKG None  Radiology No results found.  Procedures .Marland KitchenIncision and Drainage  Date/Time: 08/24/2022 7:22 PM  Performed by: Ma Rings, PA-C Authorized by: Ma Rings, PA-C   Consent:    Consent obtained:  Verbal   Consent given by:  Patient   Risks, benefits, and alternatives were discussed: yes     Risks discussed:  Bleeding, incomplete drainage, pain, infection and damage to other organs   Alternatives discussed:  No treatment and referral (antibiotics only) Universal protocol:    Procedure explained and questions answered to patient or proxy's satisfaction: yes     Patient identity confirmed:  Verbally with patient Location:    Type:  Cyst   Location:  Trunk   Trunk location:  Chest Pre-procedure details:    Skin preparation:  Povidone-iodine Sedation:    Sedation type:  None Anesthesia:     Anesthesia method:  Local infiltration   Local anesthetic:  Lidocaine 1% w/o epi Procedure type:    Complexity:  Simple Procedure details:    Incision types:  Stab incision   Incision depth:  Subcutaneous   Wound management:  Irrigated with saline   Drainage:  Purulent   Drainage amount:  Moderate   Wound treatment:  Wound left open Post-procedure details:    Procedure completion:  Tolerated with difficulty Comments:     The  inferior area that was somewhat harder patient felt also needed to be incised and drained.  I did make a small incision here, no purulent drainage noted     Medications Ordered in ED Medications  HYDROcodone-acetaminophen (NORCO/VICODIN) 5-325 MG per tablet 1 tablet (1 tablet Oral Given 08/24/22 1514)  lidocaine (PF) (XYLOCAINE) 1 % injection 30 mL (30 mLs Infiltration Given 08/24/22 1514)  doxycycline (VIBRA-TABS) tablet 100 mg (100 mg Oral Given 08/24/22 1710)    ED Course/ Medical Decision Making/ A&P                             Medical Decision Making Differential diagnosis: Abscess, cellulitis, sebaceous cyst with cellulitis, lipoma, other Course: Patient has an area on the right chest, states she has had a cyst there for a long time has been evaluated by doctors but never gave her a problem but for the past several days she has had increasing redness and swelling, he been using warm compresses and now has it "come to ahead, as she states and feels she needs to have it lanced.  Was cleansed and incision and drainage done, is a very superficial abscess, not able to put any packing in when I attempted because it is a very superficial cavity.  She will be to try to incise and drain the more inferior portion, no purulence drained from this area.  If this was able to be drained plan was to do a loop drainage but there is no larger cavity, advised on wound care, follow-up and return precautions.  Patient patient on Doxy, she is not pregnant.  Given some analgesics for  pain as she is having significant pain in this area.  Risk Prescription drug management.           Final Clinical Impression(s) / ED Diagnoses Final diagnoses:  Cutaneous abscess of chest wall    Rx / DC Orders ED Discharge Orders  Ordered    doxycycline (VIBRAMYCIN) 100 MG capsule  2 times daily,   Status:  Discontinued        08/24/22 1654    HYDROcodone-acetaminophen (NORCO) 5-325 MG tablet  Every 6 hours PRN,   Status:  Discontinued        08/24/22 1656    doxycycline (VIBRAMYCIN) 100 MG capsule  2 times daily        08/24/22 1722    HYDROcodone-acetaminophen (NORCO) 5-325 MG tablet  Every 6 hours PRN        08/24/22 1723              Ma Rings, PA-C 08/24/22 Dara Lords, MD 08/24/22 2332

## 2022-08-24 NOTE — Discharge Instructions (Signed)
Today for infection to the right chest wall.  We were able to drain a small amount of pus out of this.  You are given antibiotics.  Please make sure you have this evaluated in 2 days to make sure the redness and swelling are improving.  You likely need to follow-up with the surgeon ultimately to have the underlying cyst removed.  Come back if you have worsened symptoms or any other new symptoms.

## 2022-08-24 NOTE — ED Triage Notes (Signed)
Pt with abscess under right breast.  States she needs it cut open.   Abscess noted to underside of right breast that is about the size of a half dollar.

## 2024-01-15 ENCOUNTER — Ambulatory Visit
Admission: EM | Admit: 2024-01-15 | Discharge: 2024-01-15 | Disposition: A | Discharge status: Home / Self Care | Payer: MEDICAID | Attending: Nurse Practitioner | Admitting: Nurse Practitioner

## 2024-01-15 DIAGNOSIS — R059 Cough, unspecified: Secondary | ICD-10-CM | POA: Diagnosis not present

## 2024-01-15 DIAGNOSIS — J22 Unspecified acute lower respiratory infection: Secondary | ICD-10-CM | POA: Diagnosis not present

## 2024-01-15 MED ORDER — AMOXICILLIN-POT CLAVULANATE 875-125 MG PO TABS: 1.0000 | ORAL_TABLET | Freq: Two times a day (BID) | ORAL | 0 refills | Status: DC

## 2024-01-15 MED ORDER — ALBUTEROL SULFATE HFA 108 (90 BASE) MCG/ACT IN AERS: 2.0000 | INHALATION_SPRAY | Freq: Four times a day (QID) | RESPIRATORY_TRACT | 0 refills | Status: AC | PRN

## 2024-01-15 MED ORDER — IPRATROPIUM-ALBUTEROL 0.5-2.5 (3) MG/3ML IN SOLN
3.0000 mL | Freq: Once | RESPIRATORY_TRACT | Status: AC
  Administered 2024-01-15: 3 mL via RESPIRATORY_TRACT

## 2024-01-15 MED ORDER — PREDNISONE 20 MG PO TABS: 40.0000 mg | ORAL_TABLET | Freq: Every day | ORAL | 0 refills | Status: AC

## 2024-01-15 MED ORDER — METHYLPREDNISOLONE SODIUM SUCC 125 MG IJ SOLR
80.0000 mg | Freq: Once | INTRAMUSCULAR | Status: AC
  Administered 2024-01-15: 80 mg via INTRAMUSCULAR

## 2024-01-15 MED ORDER — PROMETHAZINE-DM 6.25-15 MG/5ML PO SYRP: 5.0000 mL | ORAL_SOLUTION | Freq: Four times a day (QID) | ORAL | 0 refills | Status: DC | PRN

## 2024-01-15 NOTE — Discharge Instructions (Signed)
 You were given a duo nebulizer treatment and Solu-Medrol 80 mg.  Start the prednisone tomorrow. Take medication as prescribed. You may take over-the-counter Tylenol  as needed for pain, fever, or general discomfort. Recommend the use of a humidifier in your bedroom at nighttime during sleep and sleeping elevated on pillows while cough symptoms persist. Recommend smoking cessation while your symptoms persist. Go to the emergency department if you experience worsening shortness of breath, wheezing, difficulty breathing, or other concerns despite being on the medication. Follow-up with your primary care physician within the next 7 to 10 days for reevaluation. Follow-up as needed.

## 2024-01-15 NOTE — ED Triage Notes (Signed)
 Pt has cough congestion fever chills, SOB, coughing spells last 10-15 mins headache bodyaches, has been seen by primary x weeks ago. Has done otc meds.

## 2024-01-15 NOTE — ED Notes (Signed)
 Provider has requested that pt ambulate to see if O2 sat stays up, started off at 97% and dropped to 94% after 4 laps from front to back of patient care area.

## 2024-01-15 NOTE — ED Provider Notes (Signed)
 [facility]    CSN: [id number] Arrival date & time: 01/15/24  1707      History   Chief Complaint No chief complaint on file.   HPI Ana Jackson is a 48 y.o. female.   The history is provided by the patient.   Patient presents with a 2-week history of cough, wheezing, chest congestion, shortness of breath, and headache.  Patient states that she has had a fever, but states she has not been able to check it at home.  She denies ear pain, ear drainage, chest pain, abdominal pain, nausea, vomiting, diarrhea, or rash.  Patient states that she does have some pain on the right side of her chest associated with the cough.  She denies any obvious close sick contacts.  States that she did see her PCP initially when symptoms starting, but was told that her symptoms were viral.  The patient's daughter states that patient has been taking over-the-counter cough and cold medications with minimal relief.  Patient reports history of smoking, states she has not been smoking as much since her symptoms started.  Past Medical History:  Diagnosis Date   Medical history non-contributory     Patient Active Problem List   Diagnosis Date Noted   MDD (major depressive disorder), severe (HCC) 09/04/2018   Condyloma acuminatum of vulva 09/05/2017   WRIST PAIN, RIGHT 10/15/2007   Headache 10/15/2007   Sprain of acromioclavicular ligament 10/15/2007   FOOT PAIN, RIGHT 12/25/2006    Past Surgical History:  Procedure Laterality Date   CHOLECYSTECTOMY      OB History     Gravida  1   Para  1   Term  1   Preterm      AB      Living  1      SAB      IAB      Ectopic      Multiple      Live Births  1            Home Medications    Prior to Admission medications   Medication Sig Start Date End Date Taking? Authorizing Provider  albuterol (VENTOLIN HFA) 108 (90 Base) MCG/ACT inhaler Inhale 2 puffs into the lungs every 6 (six) hours as needed. 01/15/24  Yes  Leath-Warren, Etta PARAS, NP  amoxicillin-clavulanate (AUGMENTIN) 875-125 MG tablet Take 1 tablet by mouth every 12 (twelve) hours. 01/15/24  Yes Leath-Warren, Etta PARAS, NP  predniSONE (DELTASONE) 20 MG tablet Take 2 tablets (40 mg total) by mouth daily with breakfast for 5 days. 01/15/24 01/20/24 Yes Leath-Warren, Etta PARAS, NP  promethazine-dextromethorphan (PROMETHAZINE-DM) 6.25-15 MG/5ML syrup Take 5 mLs by mouth 4 (four) times daily as needed. 01/15/24  Yes Leath-Warren, Etta PARAS, NP  HYDROcodone -acetaminophen  (NORCO) 5-325 MG tablet Take 1 tablet by mouth every 6 (six) hours as needed for moderate pain. 08/24/22   Beatty, Celeste A, PA-C  nicotine  (NICODERM CQ  - DOSED IN MG/24 HOURS) 21 mg/24hr patch Place 1 patch (21 mg total) onto the skin daily. 09/07/18   Ezzard Staci SAILOR, NP  NORA-BE 0.35 MG tablet Take 1 tablet by mouth daily.  09/03/17   [provider]  traZODone  (DESYREL ) 50 MG tablet Take 1 tablet (50 mg total) by mouth at bedtime and may repeat dose one time if needed. 09/07/18   Ezzard Staci SAILOR, NP    Family History Family History  Problem Relation Age of Onset   Cancer Maternal Grandmother    Suicidality  Father    Hypertension Brother     Social History Social History   Tobacco Use   Smoking status: Every Day    Types: Cigarettes   Smokeless tobacco: Never   Tobacco comments:    smokes 10 cig daily  Vaping Use   Vaping status: Never Used  Substance Use Topics   Alcohol use: Yes    Alcohol/week: 3.0 standard drinks of alcohol    Types: 3 Cans of beer per week    Comment: sometimes   Drug use: Yes    Types: Marijuana    Comment: every once in a while     Allergies   Patient has no known allergies.   Review of Systems Review of Systems Per HPI  Physical Exam Triage Vital Signs ED Triage Vitals  Encounter Vitals Group     BP      Girls Systolic BP Percentile      Girls Diastolic BP Percentile      Boys Systolic BP Percentile      Boys  Diastolic BP Percentile      Pulse      Resp      Temp      Temp src      SpO2      Weight      Height      Head Circumference      Peak Flow      Pain Score      Pain Loc      Pain Education      Exclude from Growth Chart    No data found.  Updated Vital Signs BP 104/72 (BP Location: Right Arm)   Pulse 73   Temp 100 F (37.8 C) (Oral)   Resp (!) 24   LMP  (LMP Unknown)   SpO2 92%   Visual Acuity Right Eye Distance:   Left Eye Distance:   Bilateral Distance:    Right Eye Near:   Left Eye Near:    Bilateral Near:     Physical Exam Vitals and nursing note reviewed.  Constitutional:      General: She is not in acute distress.    Appearance: Normal appearance.  HENT:     Head: Normocephalic.     Right Ear: Tympanic membrane, ear canal and external ear normal.     Left Ear: Tympanic membrane, ear canal and external ear normal.     Nose: Congestion present.     Mouth/Throat:     Mouth: Mucous membranes are moist.  Eyes:     Extraocular Movements: Extraocular movements intact.     Conjunctiva/sclera: Conjunctivae normal.     Pupils: Pupils are equal, round, and reactive to light.  Cardiovascular:     Rate and Rhythm: Normal rate and regular rhythm.     Pulses: Normal pulses.     Heart sounds: Normal heart sounds.  Pulmonary:     Effort: Pulmonary effort is normal. Prolonged expiration present.     Breath sounds: Examination of the right-upper field reveals wheezing and rhonchi. Examination of the left-upper field reveals wheezing and rhonchi. Examination of the right-lower field reveals wheezing and rhonchi. Examination of the left-lower field reveals wheezing and rhonchi. Wheezing and rhonchi present.  Abdominal:     General: Bowel sounds are normal.     Palpations: Abdomen is soft.     Tenderness: There is no abdominal tenderness.  Musculoskeletal:     Cervical back: Normal range of motion.  Skin:  General: Skin is warm and dry.  Neurological:      General: No focal deficit present.     Mental Status: She is alert and oriented to person, place, and time.  Psychiatric:        Mood and Affect: Mood normal.        Behavior: Behavior normal.      UC Treatments / Results  Labs (all labs ordered are listed, but only abnormal results are displayed) Labs Reviewed - No data to display  EKG   Radiology No results found.  Procedures Procedures (including critical care time)  Medications Ordered in UC Medications  ipratropium-albuterol (DUONEB) 0.5-2.5 (3) MG/3ML nebulizer solution 3 mL (3 mLs Nebulization Given 01/15/24 1740)  methylPREDNISolone sodium succinate (SOLU-MEDROL) 125 mg/2 mL injection 80 mg (80 mg Intramuscular Given 01/15/24 1740)    Initial Impression / Assessment and Plan / UC Course  I have reviewed the triage vital signs and the nursing notes.  Pertinent labs & imaging results that were available during my care of the patient were reviewed by me and considered in my medical decision making (see chart for details).  On initial exam, patient with wheezing and rhonchi noted throughout.  She is speaking in complete sentences without difficulty.  Baseline O2 sats were at 94%, respiratory rate was 24.  DuoNeb was administered along with Solu-Medrol 80 mg IM.  Post DuoNeb, sats increased to 97%.  Given the patient's ongoing cough and low-grade fever, concern for pneumonia.  Lung sounds improved post DuoNeb.  Vital signs at discharge, O2 sat was at 94%, respiratory rate was at 20.  Symptomatic treatment provided with prednisone 40 mg for bronchial inflammation, Promethazine DM for her cough, and an albuterol inhaler for wheezing. Supportive care recommendations wee provided and discussed with the patient to include over-the-counter analgesics, use of a humidifier, and smoking cessation.   Discussed indications with patient regarding ER follow-up. Also discussed indications with patient regarding follow-up with PCP. Patient was  in agreement with this plan of care and verbalizes understanding.  All questions were answered.  Patient stable for discharge.  Final Clinical Impressions(s) / UC Diagnoses   Final diagnoses:  Lower respiratory infection  Cough, unspecified type     Discharge Instructions      You were given a duo nebulizer treatment and Solu-Medrol 80 mg.  Start the prednisone tomorrow. Take medication as prescribed. You may take over-the-counter Tylenol  as needed for pain, fever, or general discomfort. Recommend the use of a humidifier in your bedroom at nighttime during sleep and sleeping elevated on pillows while cough symptoms persist. Recommend smoking cessation while your symptoms persist. Go to the emergency department if you experience worsening shortness of breath, wheezing, difficulty breathing, or other concerns despite being on the medication. Follow-up with your primary care physician within the next 7 to 10 days for reevaluation. Follow-up as needed.     ED Prescriptions     Medication Sig Dispense Auth. Provider   predniSONE (DELTASONE) 20 MG tablet Take 2 tablets (40 mg total) by mouth daily with breakfast for 5 days. 10 tablet Leath-Warren, Etta PARAS, NP   amoxicillin-clavulanate (AUGMENTIN) 875-125 MG tablet Take 1 tablet by mouth every 12 (twelve) hours. 14 tablet Leath-Warren, Etta PARAS, NP   promethazine-dextromethorphan (PROMETHAZINE-DM) 6.25-15 MG/5ML syrup Take 5 mLs by mouth 4 (four) times daily as needed. 118 mL Leath-Warren, Etta PARAS, NP   albuterol (VENTOLIN HFA) 108 (90 Base) MCG/ACT inhaler Inhale 2 puffs into the lungs every 6 (six)  hours as needed. 8 g Leath-Warren, Etta PARAS, NP      PDMP not reviewed this encounter.   Gilmer Etta PARAS, NP 01/15/24 [facility]

## 2024-02-04 ENCOUNTER — Encounter: Payer: Self-pay | Admitting: Emergency Medicine

## 2024-02-04 ENCOUNTER — Ambulatory Visit (INDEPENDENT_AMBULATORY_CARE_PROVIDER_SITE_OTHER): Payer: MEDICAID

## 2024-02-04 ENCOUNTER — Ambulatory Visit
Admission: EM | Admit: 2024-02-04 | Discharge: 2024-02-04 | Disposition: A | Discharge status: Home / Self Care | Payer: MEDICAID | Attending: Nurse Practitioner | Admitting: Nurse Practitioner

## 2024-02-04 DIAGNOSIS — J22 Unspecified acute lower respiratory infection: Secondary | ICD-10-CM

## 2024-02-04 DIAGNOSIS — R059 Cough, unspecified: Secondary | ICD-10-CM | POA: Diagnosis not present

## 2024-02-04 MED ORDER — IPRATROPIUM-ALBUTEROL 0.5-2.5 (3) MG/3ML IN SOLN
3.0000 mL | Freq: Once | RESPIRATORY_TRACT | Status: AC
  Administered 2024-02-04: 3 mL via RESPIRATORY_TRACT

## 2024-02-04 MED ORDER — PROMETHAZINE-DM 6.25-15 MG/5ML PO SYRP: 5.0000 mL | ORAL_SOLUTION | Freq: Four times a day (QID) | ORAL | 0 refills | Status: AC | PRN

## 2024-02-04 MED ORDER — AZITHROMYCIN 250 MG PO TABS: 250.0000 mg | ORAL_TABLET | Freq: Every day | ORAL | 0 refills | Status: AC

## 2024-02-04 MED ORDER — METHYLPREDNISOLONE SODIUM SUCC 125 MG IJ SOLR
125.0000 mg | Freq: Once | INTRAMUSCULAR | Status: AC
  Administered 2024-02-04: 125 mg via INTRAMUSCULAR

## 2024-02-04 MED ORDER — PREDNISONE 20 MG PO TABS: 40.0000 mg | ORAL_TABLET | Freq: Every day | ORAL | 0 refills | Status: AC

## 2024-02-04 NOTE — ED Provider Notes (Signed)
 [facility]    CSN: [id number] Arrival date & time: 02/04/24  1057      History   Chief Complaint Chief Complaint  Patient presents with   cough x 3 weeks    HPI Ana Jackson is a 48 y.o. female.   The history is provided by the patient.   Patient presents for follow-up for continued cough, nasal congestion, and wheezing.  Patient states cough has been remaining persistent for the past 3 weeks.  Patient was seen in this clinic on 01/15/2024 and diagnosed with a lower respiratory infection.  At that time, she was prescribed an albuterol inhaler, Augmentin, prednisone, and Promethazine DM.  She states that the cough improved, but when she completed the medication, her symptoms returned.  Patient denies fever, chills, headache, ear pain, abdominal pain, nausea, vomiting, diarrhea, or rash.  Patient is a current smoker, states that she has continued to smoke while her symptoms have been persistent.  She has not taken any additional medications since she ran out of her prescriptions.  Past Medical History:  Diagnosis Date   Medical history non-contributory     Patient Active Problem List   Diagnosis Date Noted   MDD (major depressive disorder), severe (HCC) 09/04/2018   Condyloma acuminatum of vulva 09/05/2017   WRIST PAIN, RIGHT 10/15/2007   Headache 10/15/2007   Sprain of acromioclavicular ligament 10/15/2007   FOOT PAIN, RIGHT 12/25/2006    Past Surgical History:  Procedure Laterality Date   CHOLECYSTECTOMY      OB History     Gravida  1   Para  1   Term  1   Preterm      AB      Living  1      SAB      IAB      Ectopic      Multiple      Live Births  1            Home Medications    Prior to Admission medications   Medication Sig Start Date End Date Taking? Authorizing Provider  albuterol (VENTOLIN HFA) 108 (90 Base) MCG/ACT inhaler Inhale 2 puffs into the lungs every 6 (six) hours as needed. 01/15/24   Leath-Warren,  Etta PARAS, NP  nicotine  (NICODERM CQ  - DOSED IN MG/24 HOURS) 21 mg/24hr patch Place 1 patch (21 mg total) onto the skin daily. 09/07/18   Ezzard Staci SAILOR, NP  NORA-BE 0.35 MG tablet Take 1 tablet by mouth daily.  09/03/17   [provider]  traZODone  (DESYREL ) 50 MG tablet Take 1 tablet (50 mg total) by mouth at bedtime and may repeat dose one time if needed. 09/07/18   Ezzard Staci SAILOR, NP    Family History Family History  Problem Relation Age of Onset   Cancer Maternal Grandmother    Suicidality Father    Hypertension Brother     Social History Social History   Tobacco Use   Smoking status: Every Day    Types: Cigarettes   Smokeless tobacco: Never   Tobacco comments:    smokes 10 cig daily  Vaping Use   Vaping status: Never Used  Substance Use Topics   Alcohol use: Yes    Alcohol/week: 3.0 standard drinks of alcohol    Types: 3 Cans of beer per week    Comment: sometimes   Drug use: Yes    Types: Marijuana    Comment: every once in a while  Allergies   Patient has no known allergies.   Review of Systems Review of Systems Per HPI  Physical Exam Triage Vital Signs ED Triage Vitals  Encounter Vitals Group     BP 02/04/24 1111 104/74     Girls Systolic BP Percentile --      Girls Diastolic BP Percentile --      Boys Systolic BP Percentile --      Boys Diastolic BP Percentile --      Pulse Rate 02/04/24 1111 76     Resp 02/04/24 1111 18     Temp 02/04/24 1111 98.4 F (36.9 C)     Temp Source 02/04/24 1111 Oral     SpO2 02/04/24 1111 95 %     Weight --      Height --      Head Circumference --      Peak Flow --      Pain Score 02/04/24 1113 7     Pain Loc --      Pain Education --      Exclude from Growth Chart --    No data found.  Updated Vital Signs BP 104/74 (BP Location: Right Arm)   Pulse 76   Temp 98.4 F (36.9 C) (Oral)   Resp 18   LMP  (LMP Unknown) Comment: reports no periods x 1 year  SpO2 95%   Visual Acuity Right Eye  Distance:   Left Eye Distance:   Bilateral Distance:    Right Eye Near:   Left Eye Near:    Bilateral Near:     Physical Exam Vitals and nursing note reviewed.  Constitutional:      General: She is not in acute distress.    Appearance: Normal appearance.  HENT:     Head: Normocephalic.     Right Ear: Tympanic membrane, ear canal and external ear normal.     Left Ear: Tympanic membrane, ear canal and external ear normal.     Nose: Congestion present.     Mouth/Throat:     Mouth: Mucous membranes are moist.  Eyes:     Extraocular Movements: Extraocular movements intact.     Conjunctiva/sclera: Conjunctivae normal.     Pupils: Pupils are equal, round, and reactive to light.  Cardiovascular:     Rate and Rhythm: Normal rate and regular rhythm.     Pulses: Normal pulses.     Heart sounds: Normal heart sounds.  Pulmonary:     Effort: Pulmonary effort is normal.     Breath sounds: Wheezing present.  Abdominal:     General: Bowel sounds are normal.     Palpations: Abdomen is soft.  Musculoskeletal:     Cervical back: Normal range of motion.  Skin:    General: Skin is warm and dry.  Neurological:     General: No focal deficit present.     Mental Status: She is alert and oriented to person, place, and time.  Psychiatric:        Mood and Affect: Mood normal.        Behavior: Behavior normal.      UC Treatments / Results  Labs (all labs ordered are listed, but only abnormal results are displayed) Labs Reviewed - No data to display  EKG   Radiology No results found.  Procedures Procedures (including critical care time)  Medications Ordered in UC Medications - No data to display  Initial Impression / Assessment and Plan / UC Course  I have reviewed  the triage vital signs and the nursing notes.  Pertinent labs & imaging results that were available during my care of the patient were reviewed by me and considered in my medical decision making (see chart for  details).  Patient presents for complaints of cough that is been present for the past 3 weeks.  On exam today, she does have expiratory wheezing noted throughout.  DuoNeb was administered along with Solu-Medrol 125 mg.  Patient with underlying history of smoking, she continues to smoke at this time.  Will cover for lower respiratory infection with azithromycin 250 mg.  Prednisone 40 mg prescribed, along with Promethazine DM.  Patient was advised if symptoms fail to improve with this treatment, it is recommended that she follow-up with her primary care physician for further evaluation after this treatment regimen.  Supportive care recommendations were provided and discussed to include fluids, rest, over-the-counter analgesics, and use of a humidifier during sleep.  Patient was in agreement with this plan of care and verbalizes understanding.  All questions were answered.  Patient stable for discharge.   Final Clinical Impressions(s) / UC Diagnoses   Final diagnoses:  None   Discharge Instructions   None    ED Prescriptions   None    PDMP not reviewed this encounter.   Gilmer Etta PARAS, NP 02/04/24 1220

## 2024-02-04 NOTE — ED Triage Notes (Signed)
 Cough x 3 weeks.  Has been seen here at urgent care on 10/29 for same.  States chest hurts from coughing.  Was placed on amoxicillin and states started to feel better and symptoms have returned.

## 2024-02-04 NOTE — Discharge Instructions (Signed)
 You were given a duo nebulizer treatment and Solu-Medrol 125 mg.  Start the prednisone tomorrow. Take medication as prescribed. You may take over-the-counter Tylenol  as needed for pain, fever, or general discomfort. Recommend the use of a humidifier in your bedroom at nighttime during sleep and sleeping elevated on pillows while cough symptoms persist. Recommend smoking cessation while your symptoms persist. Go to the emergency department if you experience worsening shortness of breath, wheezing, difficulty breathing, or other concerns despite being on the medication. If your symptoms fail to improve with this treatment, you will need to follow-up with your primary care physician for further evaluation of your ongoing symptoms. Follow-up as needed.
